# Patient Record
Sex: Male | Born: 1960 | Hispanic: No | Marital: Single | Smoking: Former smoker
Health system: Southern US, Community
[De-identification: ages and names within clinical notes are randomized; demographics above are authoritative.]

## PROBLEM LIST (undated history)

## (undated) DIAGNOSIS — F419 Anxiety disorder, unspecified: Secondary | ICD-10-CM

## (undated) DIAGNOSIS — F1010 Alcohol abuse, uncomplicated: Secondary | ICD-10-CM

## (undated) DIAGNOSIS — R569 Unspecified convulsions: Secondary | ICD-10-CM

## (undated) DIAGNOSIS — S069XAA Unspecified intracranial injury with loss of consciousness status unknown, initial encounter: Secondary | ICD-10-CM

## (undated) DIAGNOSIS — S069X9A Unspecified intracranial injury with loss of consciousness of unspecified duration, initial encounter: Secondary | ICD-10-CM

## (undated) DIAGNOSIS — I10 Essential (primary) hypertension: Secondary | ICD-10-CM

---

## 2014-12-24 ENCOUNTER — Encounter (HOSPITAL_COMMUNITY): Payer: Self-pay

## 2014-12-24 ENCOUNTER — Emergency Department (HOSPITAL_COMMUNITY): Payer: Medicare Other

## 2014-12-24 ENCOUNTER — Inpatient Hospital Stay (HOSPITAL_COMMUNITY)
Admission: EM | Admit: 2014-12-24 | Discharge: 2015-01-10 | DRG: 871 | Disposition: A | Discharge status: Skilled Nursing Facility | Payer: Medicare Other | Attending: Internal Medicine | Admitting: Internal Medicine

## 2014-12-24 DIAGNOSIS — Z66 Do not resuscitate: Secondary | ICD-10-CM | POA: Diagnosis not present

## 2014-12-24 DIAGNOSIS — R197 Diarrhea, unspecified: Secondary | ICD-10-CM | POA: Diagnosis not present

## 2014-12-24 DIAGNOSIS — R578 Other shock: Secondary | ICD-10-CM | POA: Diagnosis not present

## 2014-12-24 DIAGNOSIS — Z794 Long term (current) use of insulin: Secondary | ICD-10-CM

## 2014-12-24 DIAGNOSIS — R579 Shock, unspecified: Secondary | ICD-10-CM | POA: Diagnosis not present

## 2014-12-24 DIAGNOSIS — I959 Hypotension, unspecified: Secondary | ICD-10-CM | POA: Diagnosis not present

## 2014-12-24 DIAGNOSIS — G894 Chronic pain syndrome: Secondary | ICD-10-CM | POA: Diagnosis present

## 2014-12-24 DIAGNOSIS — Z95828 Presence of other vascular implants and grafts: Secondary | ICD-10-CM

## 2014-12-24 DIAGNOSIS — Z87820 Personal history of traumatic brain injury: Secondary | ICD-10-CM | POA: Diagnosis not present

## 2014-12-24 DIAGNOSIS — N201 Calculus of ureter: Secondary | ICD-10-CM

## 2014-12-24 DIAGNOSIS — Z79899 Other long term (current) drug therapy: Secondary | ICD-10-CM | POA: Diagnosis not present

## 2014-12-24 DIAGNOSIS — D62 Acute posthemorrhagic anemia: Secondary | ICD-10-CM | POA: Diagnosis present

## 2014-12-24 DIAGNOSIS — T8383XA Hemorrhage of genitourinary prosthetic devices, implants and grafts, initial encounter: Secondary | ICD-10-CM | POA: Diagnosis not present

## 2014-12-24 DIAGNOSIS — Y846 Urinary catheterization as the cause of abnormal reaction of the patient, or of later complication, without mention of misadventure at the time of the procedure: Secondary | ICD-10-CM | POA: Diagnosis not present

## 2014-12-24 DIAGNOSIS — R6521 Severe sepsis with septic shock: Secondary | ICD-10-CM | POA: Diagnosis not present

## 2014-12-24 DIAGNOSIS — Z930 Tracheostomy status: Secondary | ICD-10-CM

## 2014-12-24 DIAGNOSIS — R740 Nonspecific elevation of levels of transaminase and lactic acid dehydrogenase [LDH]: Secondary | ICD-10-CM | POA: Diagnosis not present

## 2014-12-24 DIAGNOSIS — Z4659 Encounter for fitting and adjustment of other gastrointestinal appliance and device: Secondary | ICD-10-CM

## 2014-12-24 DIAGNOSIS — J189 Pneumonia, unspecified organism: Secondary | ICD-10-CM | POA: Diagnosis present

## 2014-12-24 DIAGNOSIS — Z466 Encounter for fitting and adjustment of urinary device: Secondary | ICD-10-CM

## 2014-12-24 DIAGNOSIS — T473X5A Adverse effect of saline and osmotic laxatives, initial encounter: Secondary | ICD-10-CM | POA: Diagnosis not present

## 2014-12-24 DIAGNOSIS — Z4689 Encounter for fitting and adjustment of other specified devices: Secondary | ICD-10-CM

## 2014-12-24 DIAGNOSIS — Y95 Nosocomial condition: Secondary | ICD-10-CM | POA: Diagnosis not present

## 2014-12-24 DIAGNOSIS — D61818 Other pancytopenia: Secondary | ICD-10-CM | POA: Diagnosis present

## 2014-12-24 DIAGNOSIS — R1310 Dysphagia, unspecified: Secondary | ICD-10-CM | POA: Diagnosis not present

## 2014-12-24 DIAGNOSIS — Z931 Gastrostomy status: Secondary | ICD-10-CM | POA: Diagnosis not present

## 2014-12-24 DIAGNOSIS — I10 Essential (primary) hypertension: Secondary | ICD-10-CM | POA: Diagnosis not present

## 2014-12-24 DIAGNOSIS — G9340 Encephalopathy, unspecified: Secondary | ICD-10-CM | POA: Diagnosis not present

## 2014-12-24 DIAGNOSIS — R001 Bradycardia, unspecified: Secondary | ICD-10-CM | POA: Diagnosis not present

## 2014-12-24 DIAGNOSIS — R451 Restlessness and agitation: Secondary | ICD-10-CM | POA: Diagnosis present

## 2014-12-24 DIAGNOSIS — N179 Acute kidney failure, unspecified: Secondary | ICD-10-CM | POA: Diagnosis not present

## 2014-12-24 DIAGNOSIS — Z781 Physical restraint status: Secondary | ICD-10-CM | POA: Diagnosis not present

## 2014-12-24 DIAGNOSIS — K7460 Unspecified cirrhosis of liver: Secondary | ICD-10-CM | POA: Diagnosis not present

## 2014-12-24 DIAGNOSIS — I639 Cerebral infarction, unspecified: Secondary | ICD-10-CM

## 2014-12-24 DIAGNOSIS — I609 Nontraumatic subarachnoid hemorrhage, unspecified: Secondary | ICD-10-CM

## 2014-12-24 DIAGNOSIS — T50995A Adverse effect of other drugs, medicaments and biological substances, initial encounter: Secondary | ICD-10-CM | POA: Diagnosis not present

## 2014-12-24 DIAGNOSIS — E870 Hyperosmolality and hypernatremia: Secondary | ICD-10-CM | POA: Diagnosis not present

## 2014-12-24 DIAGNOSIS — S069XAA Unspecified intracranial injury with loss of consciousness status unknown, initial encounter: Secondary | ICD-10-CM | POA: Diagnosis present

## 2014-12-24 DIAGNOSIS — R319 Hematuria, unspecified: Secondary | ICD-10-CM | POA: Diagnosis present

## 2014-12-24 DIAGNOSIS — S3720XA Unspecified injury of bladder, initial encounter: Secondary | ICD-10-CM | POA: Diagnosis present

## 2014-12-24 DIAGNOSIS — E860 Dehydration: Secondary | ICD-10-CM | POA: Diagnosis not present

## 2014-12-24 DIAGNOSIS — D649 Anemia, unspecified: Secondary | ICD-10-CM

## 2014-12-24 DIAGNOSIS — Z515 Encounter for palliative care: Secondary | ICD-10-CM | POA: Insufficient documentation

## 2014-12-24 DIAGNOSIS — B952 Enterococcus as the cause of diseases classified elsewhere: Secondary | ICD-10-CM | POA: Diagnosis present

## 2014-12-24 DIAGNOSIS — S069X9A Unspecified intracranial injury with loss of consciousness of unspecified duration, initial encounter: Secondary | ICD-10-CM | POA: Diagnosis present

## 2014-12-24 DIAGNOSIS — J690 Pneumonitis due to inhalation of food and vomit: Secondary | ICD-10-CM | POA: Diagnosis not present

## 2014-12-24 DIAGNOSIS — R930 Abnormal findings on diagnostic imaging of skull and head, not elsewhere classified: Secondary | ICD-10-CM | POA: Diagnosis present

## 2014-12-24 DIAGNOSIS — J9690 Respiratory failure, unspecified, unspecified whether with hypoxia or hypercapnia: Secondary | ICD-10-CM

## 2014-12-24 DIAGNOSIS — G40909 Epilepsy, unspecified, not intractable, without status epilepticus: Secondary | ICD-10-CM

## 2014-12-24 DIAGNOSIS — Z0189 Encounter for other specified special examinations: Secondary | ICD-10-CM

## 2014-12-24 DIAGNOSIS — R7881 Bacteremia: Secondary | ICD-10-CM | POA: Diagnosis present

## 2014-12-24 DIAGNOSIS — A419 Sepsis, unspecified organism: Secondary | ICD-10-CM | POA: Diagnosis present

## 2014-12-24 DIAGNOSIS — F1011 Alcohol abuse, in remission: Secondary | ICD-10-CM | POA: Diagnosis present

## 2014-12-24 DIAGNOSIS — Z7401 Bed confinement status: Secondary | ICD-10-CM

## 2014-12-24 DIAGNOSIS — Z452 Encounter for adjustment and management of vascular access device: Secondary | ICD-10-CM

## 2014-12-24 HISTORY — DX: Essential (primary) hypertension: I10

## 2014-12-24 HISTORY — DX: Alcohol abuse, uncomplicated: F10.10

## 2014-12-24 HISTORY — DX: Unspecified convulsions: R56.9

## 2014-12-24 HISTORY — DX: Unspecified intracranial injury with loss of consciousness status unknown, initial encounter: S06.9XAA

## 2014-12-24 HISTORY — DX: Unspecified intracranial injury with loss of consciousness of unspecified duration, initial encounter: S06.9X9A

## 2014-12-24 HISTORY — DX: Anxiety disorder, unspecified: F41.9

## 2014-12-24 LAB — CBC WITH DIFFERENTIAL/PLATELET
Basophils Absolute: 0 10*3/uL (ref 0.0–0.1)
Basophils Relative: 0 % (ref 0–1)
Eosinophils Absolute: 0.2 10*3/uL (ref 0.0–0.7)
Eosinophils Relative: 2 % (ref 0–5)
HEMATOCRIT: 30.7 % — AB (ref 39.0–52.0)
HEMOGLOBIN: 10.1 g/dL — AB (ref 13.0–17.0)
Lymphocytes Relative: 20 % (ref 12–46)
Lymphs Abs: 1.7 10*3/uL (ref 0.7–4.0)
MCH: 30.6 pg (ref 26.0–34.0)
MCHC: 32.9 g/dL (ref 30.0–36.0)
MCV: 93 fL (ref 78.0–100.0)
MONO ABS: 0.6 10*3/uL (ref 0.1–1.0)
MONOS PCT: 7 % (ref 3–12)
NEUTROS ABS: 6.2 10*3/uL (ref 1.7–7.7)
NEUTROS PCT: 71 % (ref 43–77)
Platelets: 133 10*3/uL — ABNORMAL LOW (ref 150–400)
RBC: 3.3 MIL/uL — AB (ref 4.22–5.81)
RDW: 14.7 % (ref 11.5–15.5)
WBC: 8.7 10*3/uL (ref 4.0–10.5)

## 2014-12-24 LAB — I-STAT CG4 LACTIC ACID, ED: Lactic Acid, Venous: 1.55 mmol/L (ref 0.5–2.0)

## 2014-12-24 LAB — BASIC METABOLIC PANEL
Anion gap: 5 (ref 5–15)
BUN: 59 mg/dL — AB (ref 6–23)
CO2: 35 mmol/L — AB (ref 19–32)
Calcium: 8.7 mg/dL (ref 8.4–10.5)
Chloride: 93 mmol/L — ABNORMAL LOW (ref 96–112)
Creatinine, Ser: 4.07 mg/dL — ABNORMAL HIGH (ref 0.50–1.35)
GFR calc Af Amer: 18 mL/min — ABNORMAL LOW (ref >90)
GFR calc non Af Amer: 16 mL/min — ABNORMAL LOW (ref >90)
GLUCOSE: 105 mg/dL — AB (ref 70–99)
POTASSIUM: 4.7 mmol/L (ref 3.5–5.1)
Sodium: 133 mmol/L — ABNORMAL LOW (ref 135–145)

## 2014-12-24 LAB — TROPONIN I: TROPONIN I: 0.03 ng/mL (ref <0.031)

## 2014-12-24 LAB — PROTIME-INR
INR: 1.16 (ref 0.00–1.49)
Prothrombin Time: 14.9 seconds (ref 11.6–15.2)

## 2014-12-24 LAB — PREPARE RBC (CROSSMATCH)

## 2014-12-24 LAB — ABO/RH: ABO/RH(D): A POS

## 2014-12-24 MED ORDER — SODIUM CHLORIDE 0.9 % IV BOLUS (SEPSIS)
1000.0000 mL | Freq: Once | INTRAVENOUS | Status: AC
  Administered 2014-12-24: 1000 mL via INTRAVENOUS

## 2014-12-24 MED ORDER — DEXTROSE 5 % IV SOLN
0.0000 ug/min | Freq: Once | INTRAVENOUS | Status: DC
  Filled 2014-12-24 (×2): qty 4

## 2014-12-24 MED ORDER — LORAZEPAM 2 MG/ML IJ SOLN
1.0000 mg | Freq: Once | INTRAMUSCULAR | Status: AC
  Administered 2014-12-24: 1 mg via INTRAVENOUS

## 2014-12-24 MED ORDER — LORAZEPAM 2 MG/ML IJ SOLN
INTRAMUSCULAR | Status: AC
Start: 2014-12-24 — End: 2014-12-25
  Filled 2014-12-24: qty 1
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
## 2014-12-24 NOTE — ED Notes (Signed)
MD at bedside to start central line.
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
## 2014-12-24 NOTE — ED Notes (Signed)
Pt. Jerking as if having seizure-like activity, however patient able to look around. Dr. Manus Gunningancour at bedside. To be given

## 2014-12-24 NOTE — ED Notes (Signed)
Per EMS: [place] experienced [place], was trached with a #6 vortex trach, to tbar @28 %. Pt began having very little urinary output this evening. Night shift RN deflated foley, began seeing bright red frank blood. Foley removed, replaced with a 28Fr for irrigation. Blood continues to empty from urethra. EMS describes a firm taunt abdomen. Pt has PICC RUA, and PEG. Pt alert to self only. MD at bedside.

## 2014-12-24 NOTE — ED Notes (Signed)
Urology at bedside to place catheter. Upon removal of old catheter, patient bleed profusely from penis. After insertion, initial return of 1500 cc of blood.

## 2014-12-25 ENCOUNTER — Inpatient Hospital Stay (HOSPITAL_COMMUNITY): Payer: Medicare Other

## 2014-12-25 DIAGNOSIS — T50995A Adverse effect of other drugs, medicaments and biological substances, initial encounter: Secondary | ICD-10-CM | POA: Diagnosis not present

## 2014-12-25 DIAGNOSIS — Z87820 Personal history of traumatic brain injury: Secondary | ICD-10-CM | POA: Diagnosis not present

## 2014-12-25 DIAGNOSIS — R579 Shock, unspecified: Secondary | ICD-10-CM

## 2014-12-25 DIAGNOSIS — D62 Acute posthemorrhagic anemia: Secondary | ICD-10-CM | POA: Diagnosis not present

## 2014-12-25 DIAGNOSIS — Z930 Tracheostomy status: Secondary | ICD-10-CM

## 2014-12-25 DIAGNOSIS — R450 Nervousness: Secondary | ICD-10-CM | POA: Diagnosis not present

## 2014-12-25 DIAGNOSIS — R578 Other shock: Secondary | ICD-10-CM | POA: Diagnosis present

## 2014-12-25 DIAGNOSIS — I609 Nontraumatic subarachnoid hemorrhage, unspecified: Secondary | ICD-10-CM

## 2014-12-25 DIAGNOSIS — E860 Dehydration: Secondary | ICD-10-CM | POA: Diagnosis not present

## 2014-12-25 DIAGNOSIS — R6521 Severe sepsis with septic shock: Secondary | ICD-10-CM | POA: Diagnosis present

## 2014-12-25 DIAGNOSIS — I10 Essential (primary) hypertension: Secondary | ICD-10-CM | POA: Diagnosis not present

## 2014-12-25 DIAGNOSIS — Z7401 Bed confinement status: Secondary | ICD-10-CM

## 2014-12-25 DIAGNOSIS — R740 Nonspecific elevation of levels of transaminase and lactic acid dehydrogenase [LDH]: Secondary | ICD-10-CM | POA: Diagnosis not present

## 2014-12-25 DIAGNOSIS — R7881 Bacteremia: Secondary | ICD-10-CM

## 2014-12-25 DIAGNOSIS — Z515 Encounter for palliative care: Secondary | ICD-10-CM | POA: Diagnosis not present

## 2014-12-25 DIAGNOSIS — R930 Abnormal findings on diagnostic imaging of skull and head, not elsewhere classified: Secondary | ICD-10-CM

## 2014-12-25 DIAGNOSIS — Z931 Gastrostomy status: Secondary | ICD-10-CM | POA: Diagnosis not present

## 2014-12-25 DIAGNOSIS — G9340 Encephalopathy, unspecified: Secondary | ICD-10-CM | POA: Diagnosis not present

## 2014-12-25 DIAGNOSIS — K7460 Unspecified cirrhosis of liver: Secondary | ICD-10-CM | POA: Diagnosis not present

## 2014-12-25 DIAGNOSIS — Z781 Physical restraint status: Secondary | ICD-10-CM | POA: Diagnosis not present

## 2014-12-25 DIAGNOSIS — N179 Acute kidney failure, unspecified: Secondary | ICD-10-CM | POA: Diagnosis not present

## 2014-12-25 DIAGNOSIS — S069X9A Unspecified intracranial injury with loss of consciousness of unspecified duration, initial encounter: Secondary | ICD-10-CM | POA: Diagnosis present

## 2014-12-25 DIAGNOSIS — S069XAA Unspecified intracranial injury with loss of consciousness status unknown, initial encounter: Secondary | ICD-10-CM | POA: Diagnosis present

## 2014-12-25 DIAGNOSIS — G40909 Epilepsy, unspecified, not intractable, without status epilepticus: Secondary | ICD-10-CM

## 2014-12-25 DIAGNOSIS — F1010 Alcohol abuse, uncomplicated: Secondary | ICD-10-CM

## 2014-12-25 DIAGNOSIS — R451 Restlessness and agitation: Secondary | ICD-10-CM | POA: Diagnosis not present

## 2014-12-25 DIAGNOSIS — G894 Chronic pain syndrome: Secondary | ICD-10-CM | POA: Diagnosis present

## 2014-12-25 DIAGNOSIS — Z66 Do not resuscitate: Secondary | ICD-10-CM | POA: Diagnosis not present

## 2014-12-25 DIAGNOSIS — R1310 Dysphagia, unspecified: Secondary | ICD-10-CM | POA: Diagnosis not present

## 2014-12-25 DIAGNOSIS — J690 Pneumonitis due to inhalation of food and vomit: Secondary | ICD-10-CM | POA: Diagnosis not present

## 2014-12-25 DIAGNOSIS — Z794 Long term (current) use of insulin: Secondary | ICD-10-CM | POA: Diagnosis not present

## 2014-12-25 DIAGNOSIS — R001 Bradycardia, unspecified: Secondary | ICD-10-CM | POA: Diagnosis not present

## 2014-12-25 DIAGNOSIS — B952 Enterococcus as the cause of diseases classified elsewhere: Secondary | ICD-10-CM | POA: Diagnosis present

## 2014-12-25 DIAGNOSIS — Z466 Encounter for fitting and adjustment of urinary device: Secondary | ICD-10-CM | POA: Diagnosis not present

## 2014-12-25 DIAGNOSIS — I959 Hypotension, unspecified: Secondary | ICD-10-CM | POA: Diagnosis not present

## 2014-12-25 DIAGNOSIS — J189 Pneumonia, unspecified organism: Secondary | ICD-10-CM | POA: Diagnosis not present

## 2014-12-25 DIAGNOSIS — T473X5A Adverse effect of saline and osmotic laxatives, initial encounter: Secondary | ICD-10-CM | POA: Diagnosis not present

## 2014-12-25 DIAGNOSIS — T8383XA Hemorrhage of genitourinary prosthetic devices, implants and grafts, initial encounter: Secondary | ICD-10-CM | POA: Diagnosis not present

## 2014-12-25 DIAGNOSIS — D61818 Other pancytopenia: Secondary | ICD-10-CM | POA: Diagnosis not present

## 2014-12-25 DIAGNOSIS — Y95 Nosocomial condition: Secondary | ICD-10-CM | POA: Diagnosis not present

## 2014-12-25 DIAGNOSIS — Z95828 Presence of other vascular implants and grafts: Secondary | ICD-10-CM

## 2014-12-25 DIAGNOSIS — R319 Hematuria, unspecified: Secondary | ICD-10-CM

## 2014-12-25 DIAGNOSIS — S3720XA Unspecified injury of bladder, initial encounter: Secondary | ICD-10-CM | POA: Diagnosis present

## 2014-12-25 DIAGNOSIS — Z9889 Other specified postprocedural states: Secondary | ICD-10-CM

## 2014-12-25 DIAGNOSIS — Z79899 Other long term (current) drug therapy: Secondary | ICD-10-CM | POA: Diagnosis not present

## 2014-12-25 DIAGNOSIS — Y846 Urinary catheterization as the cause of abnormal reaction of the patient, or of later complication, without mention of misadventure at the time of the procedure: Secondary | ICD-10-CM | POA: Diagnosis not present

## 2014-12-25 DIAGNOSIS — E870 Hyperosmolality and hypernatremia: Secondary | ICD-10-CM | POA: Diagnosis not present

## 2014-12-25 DIAGNOSIS — F1011 Alcohol abuse, in remission: Secondary | ICD-10-CM | POA: Diagnosis present

## 2014-12-25 DIAGNOSIS — R197 Diarrhea, unspecified: Secondary | ICD-10-CM | POA: Diagnosis not present

## 2014-12-25 LAB — PROTIME-INR
INR: 1.22 (ref 0.00–1.49)
PROTHROMBIN TIME: 15.5 s — AB (ref 11.6–15.2)

## 2014-12-25 LAB — CBC WITH DIFFERENTIAL/PLATELET
BASOS ABS: 0 10*3/uL (ref 0.0–0.1)
Basophils Relative: 0 % (ref 0–1)
EOS ABS: 0.2 10*3/uL (ref 0.0–0.7)
Eosinophils Relative: 3 % (ref 0–5)
HCT: 25.7 % — ABNORMAL LOW (ref 39.0–52.0)
Hemoglobin: 8.5 g/dL — ABNORMAL LOW (ref 13.0–17.0)
LYMPHS ABS: 1 10*3/uL (ref 0.7–4.0)
Lymphocytes Relative: 20 % (ref 12–46)
MCH: 30.1 pg (ref 26.0–34.0)
MCHC: 33.1 g/dL (ref 30.0–36.0)
MCV: 91.1 fL (ref 78.0–100.0)
MONOS PCT: 8 % (ref 3–12)
Monocytes Absolute: 0.4 10*3/uL (ref 0.1–1.0)
Neutro Abs: 3.6 10*3/uL (ref 1.7–7.7)
Neutrophils Relative %: 70 % (ref 43–77)
PLATELETS: 86 10*3/uL — AB (ref 150–400)
RBC: 2.82 MIL/uL — AB (ref 4.22–5.81)
RDW: 15 % (ref 11.5–15.5)
WBC: 5.1 10*3/uL (ref 4.0–10.5)

## 2014-12-25 LAB — COMPREHENSIVE METABOLIC PANEL
ALK PHOS: 77 U/L (ref 39–117)
ALT: 33 U/L (ref 0–53)
AST: 31 U/L (ref 0–37)
Albumin: 2.4 g/dL — ABNORMAL LOW (ref 3.5–5.2)
Anion gap: 7 (ref 5–15)
BILIRUBIN TOTAL: 1 mg/dL (ref 0.3–1.2)
BUN: <5 mg/dL — ABNORMAL LOW (ref 6–23)
CALCIUM: 7.3 mg/dL — AB (ref 8.4–10.5)
CO2: 28 mmol/L (ref 19–32)
CREATININE: 1.8 mg/dL — AB (ref 0.50–1.35)
Chloride: 103 mmol/L (ref 96–112)
GFR calc Af Amer: 49 mL/min — ABNORMAL LOW (ref >90)
GFR calc non Af Amer: 42 mL/min — ABNORMAL LOW (ref >90)
GLUCOSE: 92 mg/dL (ref 70–99)
Potassium: 4 mmol/L (ref 3.5–5.1)
Sodium: 138 mmol/L (ref 135–145)
TOTAL PROTEIN: 5.7 g/dL — AB (ref 6.0–8.3)

## 2014-12-25 LAB — APTT
APTT: 34 s (ref 24–37)
APTT: 33 s (ref 24–37)

## 2014-12-25 LAB — VALPROIC ACID LEVEL

## 2014-12-25 LAB — LACTIC ACID, PLASMA: Lactic Acid, Venous: 0.7 mmol/L (ref 0.5–2.0)

## 2014-12-25 LAB — GLUCOSE, CAPILLARY: Glucose-Capillary: 92 mg/dL (ref 70–99)

## 2014-12-25 LAB — PHENOBARBITAL LEVEL: Phenobarbital: 27.9 ug/mL (ref 15.0–40.0)

## 2014-12-25 LAB — PROCALCITONIN: PROCALCITONIN: 0.23 ng/mL

## 2014-12-25 MED ORDER — HALOPERIDOL LACTATE 5 MG/ML IJ SOLN
1.0000 mg | INTRAMUSCULAR | Status: DC | PRN
  Administered 2014-12-25: 2 mg via INTRAVENOUS
  Administered 2014-12-25 – 2015-01-08 (×20): 4 mg via INTRAVENOUS
  Filled 2014-12-25 (×23): qty 1

## 2014-12-25 MED ORDER — ASPIRIN 300 MG RE SUPP: 300.0000 mg | RECTAL | Status: DC

## 2014-12-25 MED ORDER — ONDANSETRON HCL 4 MG/2ML IJ SOLN: 4.0000 mg | Freq: Four times a day (QID) | INTRAMUSCULAR | Status: DC | PRN

## 2014-12-25 MED ORDER — LORAZEPAM 2 MG/ML IJ SOLN
1.0000 mg | Freq: Once | INTRAMUSCULAR | Status: AC
  Administered 2014-12-25: 1 mg via INTRAVENOUS

## 2014-12-25 MED ORDER — ASPIRIN 81 MG PO CHEW: 324.0000 mg | CHEWABLE_TABLET | ORAL | Status: DC

## 2014-12-25 MED ORDER — IPRATROPIUM-ALBUTEROL 0.5-2.5 (3) MG/3ML IN SOLN: 3.0000 mL | Freq: Four times a day (QID) | RESPIRATORY_TRACT | Status: DC | PRN

## 2014-12-25 MED ORDER — DEXTROSE 5 % IV SOLN
2.0000 g | Freq: Once | INTRAVENOUS | Status: AC
  Administered 2014-12-25: 2 g via INTRAVENOUS
  Filled 2014-12-25: qty 2

## 2014-12-25 MED ORDER — MORPHINE SULFATE 2 MG/ML IJ SOLN
2.0000 mg | Freq: Once | INTRAMUSCULAR | Status: AC
Start: 2014-12-25 — End: 2014-12-25
  Administered 2014-12-25: 2 mg via INTRAVENOUS

## 2014-12-25 MED ORDER — SODIUM CHLORIDE 0.9 % IV SOLN
INTRAVENOUS | Status: DC
  Administered 2014-12-25: 03:00:00 via INTRAVENOUS

## 2014-12-25 MED ORDER — SODIUM CHLORIDE 0.9 % IV SOLN
250.0000 mL | INTRAVENOUS | Status: DC
  Administered 2014-12-26: 250 mL via INTRAVENOUS

## 2014-12-25 MED ORDER — FENTANYL CITRATE 0.05 MG/ML IJ SOLN: 50.0000 ug | Freq: Once | INTRAMUSCULAR | Status: DC

## 2014-12-25 MED ORDER — FENTANYL CITRATE 0.05 MG/ML IJ SOLN
INTRAMUSCULAR | Status: AC
Start: 2014-12-25 — End: 2014-12-25
  Administered 2014-12-25: 50 ug
  Filled 2014-12-25: qty 2

## 2014-12-25 MED ORDER — MORPHINE SULFATE 2 MG/ML IJ SOLN
2.0000 mg | INTRAMUSCULAR | Status: DC | PRN
  Administered 2014-12-25: 2 mg via INTRAVENOUS
  Filled 2014-12-25 (×2): qty 1

## 2014-12-25 MED ORDER — DEXTROSE 5 % IV SOLN
1.0000 g | Freq: Two times a day (BID) | INTRAVENOUS | Status: DC
  Administered 2014-12-25 – 2014-12-26 (×2): 1 g via INTRAVENOUS
  Filled 2014-12-25 (×3): qty 1

## 2014-12-25 MED ORDER — MORPHINE SULFATE 2 MG/ML IJ SOLN
2.0000 mg | INTRAMUSCULAR | Status: DC | PRN
Start: 2014-12-25 — End: 2014-12-31
  Administered 2014-12-25 – 2014-12-28 (×3): 2 mg via INTRAVENOUS
  Filled 2014-12-25 (×4): qty 1

## 2014-12-25 MED ORDER — DEXMEDETOMIDINE HCL IN NACL 400 MCG/100ML IV SOLN
0.4000 ug/kg/h | INTRAVENOUS | Status: DC
  Administered 2014-12-25: 0.4 ug/kg/h via INTRAVENOUS
  Administered 2014-12-26 (×2): 1 ug/kg/h via INTRAVENOUS
  Administered 2014-12-27: 0.8 ug/kg/h via INTRAVENOUS
  Administered 2014-12-27: 1 ug/kg/h via INTRAVENOUS
  Administered 2014-12-27: 1.2 ug/kg/h via INTRAVENOUS
  Administered 2014-12-27: 0.599 ug/kg/h via INTRAVENOUS
  Administered 2014-12-27: 0.8 ug/kg/h via INTRAVENOUS
  Administered 2014-12-28: 0.3 ug/kg/h via INTRAVENOUS
  Administered 2014-12-29: 0.7 ug/kg/h via INTRAVENOUS
  Filled 2014-12-25 (×12): qty 100

## 2014-12-25 MED ORDER — VANCOMYCIN HCL IN DEXTROSE 750-5 MG/150ML-% IV SOLN
750.0000 mg | Freq: Two times a day (BID) | INTRAVENOUS | Status: DC
  Administered 2014-12-25 – 2014-12-26 (×2): 750 mg via INTRAVENOUS
  Filled 2014-12-25 (×3): qty 150

## 2014-12-25 MED ORDER — VITAMIN B-1 100 MG PO TABS
100.0000 mg | ORAL_TABLET | Freq: Every day | ORAL | Status: DC
  Administered 2014-12-25 – 2014-12-31 (×7): 100 mg
  Filled 2014-12-25 (×7): qty 1

## 2014-12-25 MED ORDER — MORPHINE SULFATE 15 MG PO TABS
30.0000 mg | ORAL_TABLET | Freq: Four times a day (QID) | ORAL | Status: DC
  Administered 2014-12-25 – 2014-12-31 (×22): 30 mg via ORAL
  Filled 2014-12-25 (×23): qty 2

## 2014-12-25 MED ORDER — LORAZEPAM 2 MG/ML IJ SOLN
INTRAMUSCULAR | Status: AC
  Filled 2014-12-25: qty 1

## 2014-12-25 MED ORDER — ARIPIPRAZOLE 10 MG PO TABS
10.0000 mg | ORAL_TABLET | Freq: Two times a day (BID) | ORAL | Status: DC
  Administered 2014-12-25 – 2014-12-29 (×9): 10 mg
  Filled 2014-12-25 (×11): qty 1

## 2014-12-25 MED ORDER — LORAZEPAM 2 MG/ML IJ SOLN
2.0000 mg | INTRAMUSCULAR | Status: DC | PRN
  Administered 2014-12-25 – 2014-12-31 (×17): 2 mg via INTRAVENOUS
  Filled 2014-12-25 (×16): qty 1

## 2014-12-25 MED ORDER — BACLOFEN 10 MG PO TABS
10.0000 mg | ORAL_TABLET | Freq: Two times a day (BID) | ORAL | Status: DC
  Administered 2014-12-25 – 2014-12-31 (×13): 10 mg via ORAL
  Filled 2014-12-25 (×15): qty 1

## 2014-12-25 MED ORDER — VALPROATE SODIUM 500 MG/5ML IV SOLN
125.0000 mg | Freq: Two times a day (BID) | INTRAVENOUS | Status: DC
  Administered 2014-12-25 – 2014-12-27 (×5): 125 mg via INTRAVENOUS
  Filled 2014-12-25 (×6): qty 1.25

## 2014-12-25 MED ORDER — CHLORHEXIDINE GLUCONATE 0.12 % MT SOLN
15.0000 mL | Freq: Four times a day (QID) | OROMUCOSAL | Status: DC
  Administered 2014-12-25 – 2014-12-27 (×12): 15 mL via OROMUCOSAL
  Filled 2014-12-25 (×11): qty 15

## 2014-12-25 MED ORDER — VANCOMYCIN HCL IN DEXTROSE 1-5 GM/200ML-% IV SOLN: 1000.0000 mg | INTRAVENOUS | Status: DC

## 2014-12-25 MED ORDER — PANTOPRAZOLE SODIUM 40 MG IV SOLR
40.0000 mg | Freq: Every day | INTRAVENOUS | Status: DC
  Administered 2014-12-25 – 2014-12-26 (×2): 40 mg via INTRAVENOUS
  Filled 2014-12-25 (×4): qty 40

## 2014-12-25 MED ORDER — SODIUM CHLORIDE 0.9 % IV BOLUS (SEPSIS)
1000.0000 mL | Freq: Once | INTRAVENOUS | Status: AC
  Administered 2014-12-25: 1000 mL via INTRAVENOUS

## 2014-12-25 MED ORDER — CEFEPIME HCL 1 G IJ SOLR: 1.0000 g | INTRAMUSCULAR | Status: DC

## 2014-12-25 MED ORDER — SERTRALINE HCL 100 MG PO TABS
100.0000 mg | ORAL_TABLET | Freq: Every day | ORAL | Status: DC
  Administered 2014-12-25 – 2014-12-31 (×7): 100 mg
  Filled 2014-12-25 (×7): qty 1

## 2014-12-25 MED ORDER — PHENOBARBITAL SODIUM 65 MG/ML IJ SOLN
32.5000 mg | Freq: Three times a day (TID) | INTRAMUSCULAR | Status: DC
  Administered 2014-12-25 – 2014-12-31 (×19): 32.5 mg via INTRAVENOUS
  Filled 2014-12-25 (×19): qty 1

## 2014-12-25 MED ORDER — CLONAZEPAM 0.5 MG PO TABS: 0.5000 mg | ORAL_TABLET | Freq: Four times a day (QID) | ORAL | Status: DC | PRN

## 2014-12-25 MED ORDER — LORAZEPAM 2 MG/ML IJ SOLN
1.0000 mg | INTRAMUSCULAR | Status: DC | PRN
Start: 2014-12-25 — End: 2014-12-25
  Administered 2014-12-25 (×3): 1 mg via INTRAVENOUS
  Filled 2014-12-25 (×3): qty 1

## 2014-12-25 MED ORDER — VANCOMYCIN HCL IN DEXTROSE 1-5 GM/200ML-% IV SOLN
1000.0000 mg | Freq: Once | INTRAVENOUS | Status: AC
  Administered 2014-12-25: 1000 mg via INTRAVENOUS
  Filled 2014-12-25: qty 200

## 2014-12-25 NOTE — ED Notes (Signed)
Report attempted 

## 2014-12-25 NOTE — Progress Notes (Addendum)
ANTIBIOTIC CONSULT NOTE - INITIAL  Pharmacy Consult for vancomycin and cefepime Indication: rule out pneumonia  Allergies  Allergen Reactions  . Meropenem     Patient Measurements: Height: 5\' 8"  (172.7 cm) Weight: 201 lb 11.5 oz (91.5 kg) IBW/kg (Calculated) : 68.4 Adjusted Body Weight:   Vital Signs: Temp: 98 F (36.7 C) (04/02 0200) Temp Source: Oral (04/02 0200) BP: 119/65 mmHg (04/02 0215) Pulse Rate: 75 (04/02 0215) Intake/Output from previous day: 04/01 0701 - 04/02 0700 In: [id number]  Out: 14500 [Urine:14500] Intake/Output from this shift: Total I/O In: [id number] [Other:12800] Out: 14500 [Urine:14500]  Labs:  Recent Labs  12/24/14 2157  WBC 8.7  HGB 10.1*  PLT 133*  CREATININE 4.07*   Estimated Creatinine Clearance: 23.6 mL/min (by C-G formula based on Cr of 4.07). No results for input(s): VANCOTROUGH, VANCOPEAK, VANCORANDOM, GENTTROUGH, GENTPEAK, GENTRANDOM, TOBRATROUGH, TOBRAPEAK, TOBRARND, AMIKACINPEAK, AMIKACINTROU, AMIKACIN in the last 72 hours.   Microbiology: No results found for this or any previous visit (from the past 720 hour(s)).  Medical History: Past Medical History  Diagnosis Date  . Hypertension   . Seizures   . Anxiety   . Traumatic brain injury   . Alcohol abuse     Medications:  Prescriptions prior to admission  Medication Sig Dispense Refill Last Dose  . acetaminophen (TYLENOL) 650 MG CR tablet Give 650 mg by tube every 4 (four) hours as needed for pain.   prn  . antiseptic oral rinse (BIOTENE) LIQD 1 application by Mouth Rinse route every 2 (two) hours as needed for dry mouth.   PRN  . apixaban (ELIQUIS) 5 MG TABS tablet Take 5 mg by mouth once.   12/24/2014 at 0700  . ARIPiprazole (ABILIFY) 10 MG tablet Give 10 mg by tube every 12 (twelve) hours.    12/24/2014 at Unknown time  . ARTIFICIAL TEAR OINTMENT OP Apply 1 application to eye every 12 (twelve) hours as needed (FOR DRY EYES).   PRN  . baclofen (LIORESAL) 10 MG tablet Give 10 mg  by tube every 12 (twelve) hours.    12/24/2014 at Unknown time  . chlorhexidine (PERIDEX) 0.12 % solution Use as directed 15 mLs in the mouth or throat every 6 (six) hours. For dental care   12/24/2014 at Unknown time  . clonazePAM (KLONOPIN) 0.5 MG tablet Give 0.5 mg by tube every 6 (six) hours as needed for anxiety.    12/24/2014 at Unknown time  . diphenhydrAMINE (BENADRYL) 50 MG/ML injection Inject 25 mg into the vein every 6 (six) hours as needed for itching or allergies.   12/24/2014 at Unknown time  . divalproex (DEPAKOTE SPRINKLE) 125 MG capsule Give 125 mg by tube every 12 (twelve) hours.    12/24/2014 at Unknown time  . HYDRALAZINE HCL IJ Inject 10 mg as directed every 6 (six) hours as needed (for heart rhytym).   PRN  . Hypromellose 0.2 % SOLN Apply 2 drops to eye every 6 (six) hours.   12/24/2014 at Unknown time  . insulin regular (NOVOLIN R,HUMULIN R) 100 units/mL injection Inject into the skin 3 (three) times daily before meals.   12/24/2014 at Unknown time  . ipratropium-albuterol (DUONEB) 0.5-2.5 (3) MG/3ML SOLN Take 3 mLs by nebulization every 6 (six) hours as needed (for shortness of breath).   PRN  . LISINOPRIL PO Give 1 tablet by tube daily.   unknown  . LORAZEPAM IJ Inject 1 mg as directed every 4 (four) hours as needed (for agitation).   PRN  .  morphine (MSIR) 30 MG tablet Give 30 mg by tube every 6 (six) hours.    12/24/2014 at Unknown time  . Morphine Sulfate, PF, 2 MG/ML SOLN Inject 2 mg into the vein every 4 (four) hours as needed (for pain).   PRN  . neomycin-bacitracin-polymyxin (NEOSPORIN) OINT Apply 1 application topically every 12 (twelve) hours.   12/24/2014 at Unknown time  . Nutritional Supplements (*STUDY* FEEDING SUPPLEMENT, IMPACT PEPTIDE 1.5,) LIQD Place 300 mLs into feeding tube every 6 (six) hours.   12/24/2014 at Unknown time  . nystatin cream (MYCOSTATIN) Apply 1 application topically 3 (three) times daily.   12/24/2014 at Unknown time  . ondansetron (ZOFRAN) 4 MG/2ML SOLN  injection Inject 4 mg into the vein every 4 (four) hours as needed for nausea or vomiting.   PRN  . pantoprazole (PROTONIX) 40 MG injection Inject 40 mg into the vein daily.   12/24/2014 at Unknown time  . PHENobarbital (LUMINAL) 32.4 MG tablet Give 32.4 mg by tube every 8 (eight) hours.    12/24/2014 at Unknown time  . polyethylene glycol (MIRALAX / GLYCOLAX) packet Give 17 g by tube daily.    12/24/2014 at Unknown time  . sertraline (ZOLOFT) 100 MG tablet Give 100 mg by tube daily.    12/24/2014 at Unknown time  . Skin Protectants, Misc. (EUCERIN) cream Apply 1 application topically every 12 (twelve) hours.   12/24/2014 at Unknown time  . thiamine (VITAMIN B-1) 100 MG tablet Give 100 mg by tube daily.    12/24/2014 at Unknown time  . Water For Irrigation, Sterile (FREE WATER) SOLN Place 100 mLs into feeding tube every 4 (four) hours.   12/24/2014 at Unknown time   Scheduled:  . sodium chloride   Intravenous STAT  . ceFEPime (MAXIPIME) IV  2 g Intravenous Once  . norepinephrine (LEVOPHED) Adult infusion  0-40 mcg/min Intravenous Once  . sodium chloride  1,000 mL Intravenous Once  . vancomycin  1,000 mg Intravenous Once   Infusions:   Assessment: 53yo male with history of HTN, seizures, TBI and EtOH abuse presents with hematuria. Pharmacy is consulted to dose cefepime and vancomycin for suspected HCAP. Pt is afebrile, WBC wnl, sCr 4.1, LA 1.6.  Goal of Therapy:  Vancomycin trough level 15-20 mcg/ml  Plan:  Cefepime 2g IV once followed by 1g q24h Vancomycin 1g IV q24h Expected duration 7 days with resolution of temperature and/or normalization of WBC Measure antibiotic drug levels at steady state Follow up culture results, renal function, and clinical course  Arlean Hopping. Newman Pies, PharmD Clinical Pharmacist Pager [phone] 12/25/2014,2:48 AM   ADDENDUM:  Pt's SCr improved significantly now down to 1.8. CrCl ~2ml/min.  Plan: Change cefepime to 1g IV Q12 Change vancomycin to  IV Q12

## 2014-12-25 NOTE — Progress Notes (Signed)
Triple lumen left subclavian central line placement verified by CXR. Okay to use per Dr. Arsenio LoaderSommer.

## 2014-12-25 NOTE — Progress Notes (Signed)
Called by RN for pt dropped sat to 80's. RN had attempted to suction and received small secretions. RT attempted and received copious amounts of thick yellow sputum. SPO2 97% Pt tolerated well. RT will continue to monitor. BBS clear; diminished.
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
## 2014-12-25 NOTE — Progress Notes (Signed)
Patient seen and examined, currently vital stable, no purposeful interaction, not seem in distress at this moment. S/p trach on 5liter oxygen, Lung rhonchi bilateral, heart RRR, ab, peg tube removed (by patient earlier during MRI) testing. On TBI, foley bag with visible hematuria.  Critical care/urology following. Critical care ordered ng for now and IR to replace PEG tube.
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
## 2014-12-25 NOTE — Progress Notes (Signed)
INITIAL NUTRITION ASSESSMENT  DOCUMENTATION CODES Per approved criteria  -Obesity Unspecified   INTERVENTION: Once pt is able to start nutrition,  Recommend Vital AF 1.2 via PEG at 20 ml/hr and increase by 10 ml every 4 hours to goal rate of 70 to provide 2016 kcal, 126 grams of protein, and 1361 ml of free water.  NUTRITION DIAGNOSIS: Inadequate oral intake related to inability to eat as evidenced by NPO status  Goal: Pt to meet >/= 90% of their estimated nutrition needs   Monitor:  TF initiation/tolerance, weight trends, labs, I/O's  Reason for Assessment: MD consult for assessment and recommendations for tube feeding  54 y.o. male  Admitting Dx: Hemorrhagic shock  ASSESSMENT: Pt with PMH of TBI, subarachnoid hemorrhage, subdural hemorrhage, cerebral contusion, hypertension, alcohol abuse, tracheostomy status, PEG tube status, multiple rib fractures, T1 fracture, enterococcal bacteremia, IVC filter placement. Pt from [facility], presents with hematuria.  Per MD note, pt with sluggish bowel sounds and will hold TF until bowel movements. Noted pt with a J-tube. During time of visit, pt was unresponsive to questions asked. No family at bedside. Unable to obtain nutrition hx. Spoke with RN, she confirms pt has been nonverbal since admission. Plans for MRI today.   Pt with no observed significant fat or muscle mass loss.   Labs: Low BUN, calcium, and GFR. High creatinine.  Height: Ht Readings from Last 1 Encounters:  12/25/14 5\' 8"  (1.727 m)    Weight: Wt Readings from Last 1 Encounters:  12/25/14 201 lb 11.5 oz (91.5 kg)    Ideal Body Weight: 154 lbs  % Ideal Body Weight: 131%  Wt Readings from Last 10 Encounters:  12/25/14 201 lb 11.5 oz (91.5 kg)    Usual Body Weight: Unknown  % Usual Body Weight: ---  BMI:  Body mass index is 30.68 kg/(m^2). Class I obesity  Estimated Nutritional Needs: Kcal: 2000-2200 Protein: 110-125 grams Fluid: 2 - 2.2 L/day  Skin:  +1 generalized edema  Diet Order: Diet NPO time specified Except for: Sips with Meds  EDUCATION NEEDS: -Education not appropriate at this time   Intake/Output Summary (Last 24 hours) at 12/25/14 0752 Last data filed at 12/25/14 0500  Gross per 24 hour  Intake  [id number] ml  Output  19650 ml  Net   -515 ml    Last BM: PTA  Labs:   Recent Labs Lab 12/24/14 2157  NA 133*  K 4.7  CL 93*  CO2 35*  BUN 59*  CREATININE 4.07*  CALCIUM 8.7  GLUCOSE 105*    CBG (last 3)   Recent Labs  12/24/14 2136  GLUCAP 92    Scheduled Meds: . ARIPiprazole  10 mg Per Tube Q12H  . baclofen  10 mg Oral Q12H  . [START ON 12/26/2014] ceFEPime (MAXIPIME) IV  1 g Intravenous Q24H  . chlorhexidine  15 mL Mouth/Throat Q6H  . morphine  30 mg Oral Q6H  . norepinephrine (LEVOPHED) Adult infusion  0-40 mcg/min Intravenous Once  . pantoprazole  40 mg Intravenous Daily  . PHENObarbital  32.5 mg Intravenous 3 times per day  . sertraline  100 mg Per Tube Daily  . thiamine  100 mg Per Tube Daily  . valproate sodium  125 mg Intravenous Q12H  . [START ON 12/26/2014] vancomycin  1,000 mg Intravenous Q24H    Continuous Infusions: . sodium chloride 250 mL (12/25/14 0356)    Past Medical History  Diagnosis Date  . Hypertension   . Seizures   .  Anxiety   . Traumatic brain injury   . Alcohol abuse     No past surgical history on file.  Marijean Niemann, MS, RD, LDN Pager # [phone] After hours/ weekend pager # [phone]

## 2014-12-25 NOTE — Procedures (Signed)
Central Venous Catheter Insertion Procedure Note Antonio EpsteinJack Tyler [id number] April 06, 1961  Procedure: Insertion of Central Venous Catheter Indications: Assessment of intravascular volume, Drug and/or fluid administration and Frequent blood sampling  Procedure Details Consent: Unable to obtain consent because of emergent medical necessity. Time Out: Verified patient identification, verified procedure, site/side was marked, verified correct patient position, special equipment/implants available, medications/allergies/relevent history reviewed, required imaging and test results available.  Performed  Maximum sterile technique was used including antiseptics, cap, gloves, gown, hand hygiene, mask and sheet. Skin prep: Chlorhexidine; local anesthetic administered A antimicrobial bonded/coated triple lumen catheter was placed in the left subclavian vein using the Seldinger technique.  Evaluation Blood flow good Complications: No apparent complications Patient did tolerate procedure well. Chest X-ray ordered to verify placement.  CXR: normal.  Antonio Tyler 12/25/2014, 5:48 AM

## 2014-12-26 DIAGNOSIS — R1310 Dysphagia, unspecified: Secondary | ICD-10-CM

## 2014-12-26 DIAGNOSIS — R451 Restlessness and agitation: Secondary | ICD-10-CM | POA: Diagnosis present

## 2014-12-26 DIAGNOSIS — R450 Nervousness: Secondary | ICD-10-CM

## 2014-12-26 LAB — CBC
HCT: 23.6 % — ABNORMAL LOW (ref 39.0–52.0)
HEMATOCRIT: 27.8 % — AB (ref 39.0–52.0)
HEMOGLOBIN: 9.2 g/dL — AB (ref 13.0–17.0)
Hemoglobin: 7.8 g/dL — ABNORMAL LOW (ref 13.0–17.0)
MCH: 30.1 pg (ref 26.0–34.0)
MCH: 30.6 pg (ref 26.0–34.0)
MCHC: 33.1 g/dL (ref 30.0–36.0)
MCHC: 33.1 g/dL (ref 30.0–36.0)
MCV: 90.8 fL (ref 78.0–100.0)
MCV: 92.5 fL (ref 78.0–100.0)
Platelets: 72 10*3/uL — ABNORMAL LOW (ref 150–400)
Platelets: 73 10*3/uL — ABNORMAL LOW (ref 150–400)
RBC: 2.55 MIL/uL — ABNORMAL LOW (ref 4.22–5.81)
RBC: 3.06 MIL/uL — AB (ref 4.22–5.81)
RDW: 15.2 % (ref 11.5–15.5)
RDW: 15.2 % (ref 11.5–15.5)
WBC: 3 10*3/uL — AB (ref 4.0–10.5)
WBC: 3.2 10*3/uL — AB (ref 4.0–10.5)

## 2014-12-26 LAB — TYPE AND SCREEN
ABO/RH(D): A POS
Antibody Screen: NEGATIVE
UNIT DIVISION: 0
Unit division: 0
Unit division: 0
Unit division: 0

## 2014-12-26 LAB — BASIC METABOLIC PANEL
Anion gap: 8 (ref 5–15)
BUN: 29 mg/dL — ABNORMAL HIGH (ref 6–23)
CO2: 28 mmol/L (ref 19–32)
CREATININE: 0.67 mg/dL (ref 0.50–1.35)
Calcium: 8.6 mg/dL (ref 8.4–10.5)
Chloride: 105 mmol/L (ref 96–112)
GFR calc non Af Amer: >90 mL/min (ref >90)
Glucose, Bld: 94 mg/dL (ref 70–99)
Potassium: 3.9 mmol/L (ref 3.5–5.1)
SODIUM: 141 mmol/L (ref 135–145)

## 2014-12-26 MED ORDER — VANCOMYCIN HCL IN DEXTROSE 1-5 GM/200ML-% IV SOLN
1000.0000 mg | Freq: Three times a day (TID) | INTRAVENOUS | Status: DC
  Administered 2014-12-26 – 2014-12-28 (×6): 1000 mg via INTRAVENOUS
  Filled 2014-12-26 (×7): qty 200

## 2014-12-26 MED ORDER — DEXTROSE 5 % IV SOLN
1.0000 g | Freq: Three times a day (TID) | INTRAVENOUS | Status: DC
  Administered 2014-12-26 – 2014-12-28 (×6): 1 g via INTRAVENOUS
  Filled 2014-12-26 (×7): qty 1
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
## 2014-12-26 NOTE — Progress Notes (Signed)
UR Completed.  [phone]  
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
## 2014-12-26 NOTE — Progress Notes (Signed)
ANTIBIOTIC CONSULT NOTE - FOLLOW UP  Pharmacy Consult for Vancomycin and cefepime Indication: r/o PNA  Allergies  Allergen Reactions  . Meropenem     Patient Measurements: Height: 5\' 8"  (172.7 cm) Weight: 199 lb 1.2 oz (90.3 kg) IBW/kg (Calculated) : 68.4  Vital Signs: Temp: 98.1 F (36.7 C) (04/03 0800) Temp Source: Axillary (04/03 0400) BP: 64/57 mmHg (04/03 0726) Pulse Rate: 65 (04/03 0726) Intake/Output from previous day: 04/02 0701 - 04/03 0700 In: 36120.4 [I.V.:2359.1; NG/GT:110; IV Piggyback:551.3] Out: [id number] [Urine:34650] Intake/Output from this shift:    Labs:  Recent Labs  12/24/14 2157 12/25/14 0711 12/25/14 2343 12/26/14 0300  WBC 8.7 5.1 3.2* 3.0*  HGB 10.1* 8.5* 7.8* 9.2*  PLT 133* 86* 72* 73*  CREATININE 4.07* 1.80*  --  0.67   Estimated Creatinine Clearance: 119.3 mL/min (by C-G formula based on Cr of 0.67). No results for input(s): VANCOTROUGH, VANCOPEAK, VANCORANDOM, GENTTROUGH, GENTPEAK, GENTRANDOM, TOBRATROUGH, TOBRAPEAK, TOBRARND, AMIKACINPEAK, AMIKACINTROU, AMIKACIN in the last 72 hours.   Microbiology: Recent Results (from the past 720 hour(s))  Blood culture (routine x 2)     Status: None (Preliminary result)   Collection Time: 12/24/14 11:35 PM  Result Value Ref Range Status   Specimen Description BLOOD LEFT HAND  Final   Special Requests BOTTLES DRAWN AEROBIC ONLY 8CC  Final   Culture   Final           BLOOD CULTURE RECEIVED NO GROWTH TO DATE CULTURE WILL BE HELD FOR 5 DAYS BEFORE ISSUING A FINAL NEGATIVE REPORT [facility]    Report Status PENDING  Incomplete  Blood culture (routine x 2)     Status: None (Preliminary result)   Collection Time: 12/25/14 12:34 AM  Result Value Ref Range Status   Specimen Description BLOOD LEFT HAND  Final   Special Requests BOTTLES DRAWN AEROBIC AND ANAEROBIC 5CC  Final   Culture   Final           BLOOD CULTURE RECEIVED NO GROWTH TO DATE CULTURE WILL BE HELD FOR 5 DAYS BEFORE  ISSUING A FINAL NEGATIVE REPORT Performed at [facility]    Report Status PENDING  Incomplete    Anti-infectives    Start     Dose/Rate Route Frequency Ordered Stop   12/26/14 0300  ceFEPIme (MAXIPIME) 1 g in dextrose 5 % 50 mL IVPB  Status:  Discontinued     1 g 100 mL/hr over 30 Minutes Intravenous Every 24 hours 12/25/14 0253 12/25/14 1232   12/26/14 0300  vancomycin (VANCOCIN) IVPB 1000 mg/200 mL premix  Status:  Discontinued     1,000 mg 200 mL/hr over 60 Minutes Intravenous Every 24 hours 12/25/14 0253 12/25/14 1232   12/25/14 1700  ceFEPIme (MAXIPIME) 1 g in dextrose 5 % 50 mL IVPB     1 g 100 mL/hr over 30 Minutes Intravenous Every 12 hours 12/25/14 1232     12/25/14 1600  vancomycin (VANCOCIN) IVPB 750 mg/150 ml premix     750 mg 150 mL/hr over 60 Minutes Intravenous Every 12 hours 12/25/14 1232     12/25/14 0300  ceFEPIme (MAXIPIME) 2 g in dextrose 5 % 50 mL IVPB     2 g 100 mL/hr over 30 Minutes Intravenous  Once 12/25/14 0243 12/25/14 0430   12/25/14 0300  vancomycin (VANCOCIN) IVPB 1000 mg/200 mL premix     1,000 mg 200 mL/hr over 60 Minutes Intravenous  Once 12/25/14 0243 12/25/14 0500  Assessment: 54yo male with history of HTN, seizures, TBI and EtOH abuse presents with hematuria. Pharmacy is consulted to dose cefepime and vancomycin for suspected HCAP. Now day #2 for r/o PNA. Pt is afebrile, WBC wnl. SCr drastically improved to 0.67 from 1.8.  Goal of Therapy:  Vancomycin trough level 15-20 mcg/ml  Resolution of infection  Plan:  Change Cefepime to 1g IV Q8 Change Vancomycin to 1g IV Q8 Monitor renal function, clinical picture, VT prn F/U C&S, need for abx?  Antonio Tyler 12/26/2014,9:46 AM
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
## 2014-12-26 NOTE — Progress Notes (Signed)
In report, RN was told that patient did not follow commands and was agitated throughout the day and pulled out PEG tube. When assessing patient at 1930, he was very agitated, gave 4 of haldol, and it didn't help. Gave 1 mg of ativan, at this point around 2000 patient was very agitated and pulled out part of the CBI and physically combative trying to pull out tubes, unable to calm down. Gave 2 of morphine, still unable to calm pt down. Called Elink and got an order of another 1mg  ativan. Three RNs required to be in the room to help calm the patient and prevent pulling out foley/CBI and other lines. Elink MD aware and visually patient, also the fellow Christianto came to bedside. Orders received. Pt on medication and restrained. Will continue to monitor.

## 2014-12-26 NOTE — Progress Notes (Signed)
IR PA aware of request for replacement of gastrostomy tube that has fell out 4/2, after reviewing images with Dr. Miles CostainShick the previous G-tube did extend through the inferior edge of the liver and we would recommend a brand new placement in a lower location without liver involvement. Will discuss further with Radiologist regarding time for healing of previous site this week and plan for placement when appropriate. Patient with NGT now.   Antonio BossKoreen Vail Vuncannon PA-C Interventional Radiology  12/26/14  10:49 AM

## 2014-12-27 ENCOUNTER — Inpatient Hospital Stay (HOSPITAL_COMMUNITY): Payer: Medicare Other

## 2014-12-27 DIAGNOSIS — S069X0A Unspecified intracranial injury without loss of consciousness, initial encounter: Secondary | ICD-10-CM

## 2014-12-27 LAB — BASIC METABOLIC PANEL
ANION GAP: 7 (ref 5–15)
BUN: 13 mg/dL (ref 6–23)
CALCIUM: 8.4 mg/dL (ref 8.4–10.5)
CO2: 30 mmol/L (ref 19–32)
Chloride: 109 mmol/L (ref 96–112)
Creatinine, Ser: 0.51 mg/dL (ref 0.50–1.35)
GFR calc non Af Amer: >90 mL/min (ref >90)
Glucose, Bld: 124 mg/dL — ABNORMAL HIGH (ref 70–99)
POTASSIUM: 3.6 mmol/L (ref 3.5–5.1)
SODIUM: 146 mmol/L — AB (ref 135–145)

## 2014-12-27 LAB — HEPATIC FUNCTION PANEL
ALBUMIN: 2.6 g/dL — AB (ref 3.5–5.2)
ALT: 35 U/L (ref 0–53)
AST: 37 U/L (ref 0–37)
Alkaline Phosphatase: 84 U/L (ref 39–117)
Bilirubin, Direct: 0.2 mg/dL (ref 0.0–0.5)
Indirect Bilirubin: 0.6 mg/dL (ref 0.3–0.9)
TOTAL PROTEIN: 5.8 g/dL — AB (ref 6.0–8.3)
Total Bilirubin: 0.8 mg/dL (ref 0.3–1.2)

## 2014-12-27 LAB — PHOSPHORUS: Phosphorus: 3.3 mg/dL (ref 2.3–4.6)

## 2014-12-27 LAB — MAGNESIUM: Magnesium: 1.7 mg/dL (ref 1.5–2.5)

## 2014-12-27 LAB — CULTURE, RESPIRATORY W GRAM STAIN

## 2014-12-27 LAB — CULTURE, RESPIRATORY

## 2014-12-27 LAB — CBC
HEMATOCRIT: 26.8 % — AB (ref 39.0–52.0)
HEMOGLOBIN: 8.7 g/dL — AB (ref 13.0–17.0)
MCH: 29.7 pg (ref 26.0–34.0)
MCHC: 32.5 g/dL (ref 30.0–36.0)
MCV: 91.5 fL (ref 78.0–100.0)
Platelets: 75 10*3/uL — ABNORMAL LOW (ref 150–400)
RBC: 2.93 MIL/uL — ABNORMAL LOW (ref 4.22–5.81)
RDW: 14.7 % (ref 11.5–15.5)
WBC: 2.5 10*3/uL — AB (ref 4.0–10.5)

## 2014-12-27 LAB — AMMONIA: Ammonia: 41 umol/L — ABNORMAL HIGH (ref 11–32)

## 2014-12-27 MED ORDER — PNEUMOCOCCAL VAC POLYVALENT 25 MCG/0.5ML IJ INJ
0.5000 mL | INJECTION | INTRAMUSCULAR | Status: AC
  Administered 2014-12-28: 0.5 mL via INTRAMUSCULAR
  Filled 2014-12-27: qty 0.5

## 2014-12-27 MED ORDER — SODIUM CHLORIDE 0.9 % IR SOLN
3000.0000 mL | Status: DC
  Administered 2014-12-27: 3000 mL

## 2014-12-27 MED ORDER — SODIUM CHLORIDE 0.9 % IR SOLN
3000.0000 mL | Status: DC
  Administered 2014-12-25 – 2014-12-31 (×34): 3000 mL

## 2014-12-27 MED ORDER — SODIUM CHLORIDE 0.9 % IV SOLN
250.0000 mL | INTRAVENOUS | Status: DC
  Administered 2014-12-27: 250 mL via INTRAVENOUS
  Administered 2014-12-29: 50 mL/h via INTRAVENOUS

## 2014-12-27 MED ORDER — INFLUENZA VAC SPLIT QUAD 0.5 ML IM SUSY
0.5000 mL | PREFILLED_SYRINGE | INTRAMUSCULAR | Status: AC
  Administered 2014-12-28: 0.5 mL via INTRAMUSCULAR
  Filled 2014-12-27: qty 0.5

## 2014-12-27 MED ORDER — VALPROIC ACID 250 MG/5ML PO SYRP
125.0000 mg | ORAL_SOLUTION | Freq: Two times a day (BID) | ORAL | Status: DC
  Administered 2014-12-27 – 2014-12-29 (×5): 125 mg
  Filled 2014-12-27 (×6): qty 2.5

## 2014-12-27 MED ORDER — PANTOPRAZOLE SODIUM 40 MG PO PACK
40.0000 mg | PACK | ORAL | Status: DC
  Administered 2014-12-27 – 2014-12-31 (×5): 40 mg
  Filled 2014-12-27 (×6): qty 20

## 2014-12-27 MED ORDER — LACTULOSE 10 GM/15ML PO SOLN
30.0000 g | Freq: Two times a day (BID) | ORAL | Status: DC
  Administered 2014-12-27 – 2014-12-28 (×4): 30 g
  Filled 2014-12-27 (×6): qty 45

## 2014-12-27 MED ORDER — LISINOPRIL 5 MG PO TABS
5.0000 mg | ORAL_TABLET | Freq: Every day | ORAL | Status: DC
  Administered 2014-12-27 – 2014-12-28 (×2): 5 mg via ORAL
  Filled 2014-12-27 (×2): qty 1

## 2014-12-27 MED ORDER — HYDRALAZINE HCL 20 MG/ML IJ SOLN: 10.0000 mg | Freq: Three times a day (TID) | INTRAMUSCULAR | Status: DC | PRN
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
## 2014-12-27 NOTE — Progress Notes (Addendum)
PULMONARY / CRITICAL CARE MEDICINE HISTORY AND PHYSICAL EXAMINATION   Name: Antonio EpsteinJack Henriques MRN: [id number] DOB: 1960/11/04    ADMISSION DATE:  12/24/2014  PRIMARY SERVICE: PCCM  CHIEF COMPLAINT: Penile bleeding  BRIEF PATIENT DESCRIPTION: 53yom with H/o TBI with trach and PEG, in an LTACH, presents with profuse penile bleeding in setting of recent chronic foley change.  SIGNIFICANT EVENTS:  STUDIES:  4/02 CT abd/pelvis >> bladder filled with blood, CBD at 8 mm, basilar ASD, splenomegaly, cirrhosis 4/02 CT head >> Lt temporal hypodense area, Rt posterior temporal lobe encephalomalacia  SUBJECTIVE:  Unresponsive  VITAL SIGNS: Temp:  [96.9 F (36.1 C)-98.1 F (36.7 C)] 97.9 F (36.6 C) (04/04 0400) Pulse Rate:  [44-79] 67 (04/04 0700) Resp:  [12-23] 20 (04/04 0700) BP: (104-192)/(49-128) 120/85 mmHg (04/04 0700) SpO2:  [96 %-100 %] 100 % (04/04 0700) FiO2 (%):  [28 %] 28 % (04/04 0400) Weight:  [200 lb 13.4 oz (91.1 kg)] 200 lb 13.4 oz (91.1 kg) (04/04 0359) HEMODYNAMICS: CVP:  [6 mmHg-11 mmHg] 7 mmHg VENTILATOR SETTINGS: Vent Mode:  [-]  FiO2 (%):  [28 %] 28 % INTAKE / OUTPUT: Intake/Output      04/03 0701 - 04/04 0700 04/04 0701 - 04/05 0700   I.V. (mL/kg) 2565.1 (28.2)    Other 12900    NG/GT 60    IV Piggyback 601    Total Intake(mL/kg) 16126.1 (177)    Urine (mL/kg/hr) 21700 (9.9)    Total Output [id number]     Net -5573.9            PHYSICAL EXAMINATION: General: unresponsive Neuro: RASS -4 HEENT:  Trach site clean Cardiovascular: regular Lungs: scattered rhonchi Abdomen:  Soft, PEG tube in place Musculoskeletal:  No edema Skin:  Warm, no rash  LABS:  CBC  Recent Labs Lab 12/25/14 2343 12/26/14 0300 12/27/14 0415  WBC 3.2* 3.0* 2.5*  HGB 7.8* 9.2* 8.7*  HCT 23.6* 27.8* 26.8*  PLT 72* 73* 75*   Coag's  Recent Labs Lab 12/24/14 2157 12/24/14 2335 12/25/14 0711  APTT  --  33 34  INR 1.16  --  1.22   BMET  Recent Labs Lab 12/25/14 0711  12/26/14 0300 12/27/14 0415  NA 138 141 146*  K 4.0 3.9 3.6  CL 103 105 109  CO2 28 28 30   BUN <5* 29* 13  CREATININE 1.80* 0.67 0.51  GLUCOSE 92 94 124*   Electrolytes  Recent Labs Lab 12/25/14 0711 12/26/14 0300 12/27/14 0415  CALCIUM 7.3* 8.6 8.4  MG  --   --  1.7  PHOS  --   --  3.3   Sepsis Markers  Recent Labs Lab 12/24/14 2205 12/25/14 0711 12/25/14 0713  LATICACIDVEN 1.55  --  0.7  PROCALCITON  --  0.23  --    ABG No results for input(s): PHART, PCO2ART, PO2ART in the last 168 hours.   Liver Enzymes  Recent Labs Lab 12/25/14 0711  AST 31  ALT 33  ALKPHOS 77  BILITOT 1.0  ALBUMIN 2.4*   Cardiac Enzymes  Recent Labs Lab 12/24/14 2157  TROPONINI 0.03   Glucose  Recent Labs Lab 12/24/14 2136  GLUCAP 92    Imaging Dg Chest Port 1 View  12/27/2014   CLINICAL DATA:  Respiratory failure .  EXAM: PORTABLE CHEST - 1 VIEW  COMPARISON:  12/25/2014.  FINDINGS: Tracheostomy tube, NG tube, left subclavian line in stable position. Stable cardiomegaly. Persistent bibasilar atelectasis. Mild infiltrate right upper lobe cannot be excluded.  No prominent pleural effusion. No pneumothorax. Stable cardiomegaly with normal pulmonary vascularity.  IMPRESSION: 1. Lines and tubes in stable position. 2. Persistent bibasilar atelectasis. Mild infiltrate in the right upper lobe cannot be excluded. 3. Stable cardiomegaly.  No pulmonary venous congestion.   Electronically Signed   By: Maisie Fus  Register   On: 12/27/2014 07:13   Dg Abd Portable 1v  12/25/2014   CLINICAL DATA:  Evaluate nasojejunal tube.  EXAM: PORTABLE ABDOMEN - 1 VIEW  COMPARISON:  CT of earlier today.  FINDINGS: Presumed nasogastric tube terminates at the body of the stomach with side port in the proximal stomach. IVC filter identified.  Non-obstructive bowel gas pattern. Pelvis excluded. Cardiomegaly. Right hemidiaphragm elevation.  IMPRESSION: Presumed nasogastric tube terminating at the body of the stomach.    Electronically Signed   By: Jeronimo Greaves M.D.   On: 12/25/2014 17:11    ASSESSMENT / PLAN:  PULMONARY A: Hx of tracheostomy. P:   Monitor respiratory status  CARDIOVASCULAR Lt [place] CVL 4/02 >> A: Hemorrhagic shock 2nd to GU bleeding >> resolved. Hx of HTN. Hx of IVC filter. P:   Per primary team Hold eliquis  RENAL A: AKI, suspect postobstructive >> resolved. Hematuria >> likely from foley trauma. P:   CBI per urology  GASTROINTESTINAL A: Dysphagia s/p PEG. Changes of cirrhosis on CT abd/pelvis with hx of ETOH. Enlarged CBD on CT abd/pelvis. P:   F/u LFT Primary team to address placement of PEG  HEMATOLOGIC A: GU bleeding. Thrombocytopenia. P:   F/u CBC Transfuse for Hb < 7 or bleeding  INFECTIOUS A: Aspiration PNA. P:   Day 3 of vancomycin, cefepime  4/2 sputum>> 4/2 bc x 2>>  ENDOCRINE A: No acute issues P:   Monitor blood sugar on BMET  NEUROLOGIC A: Acute encephalopathy. Hx of TBI, seizures. H/o seizure d/o P:   Precedex for RASS goal 0 Check ammonia level Add lactulose 4/04 Continue abilify, baclofen, morphine, zoloft, depacon, phenobarb Might need psychiatry evaluation  CC time 35 minutes.  [name], MD [facility] Pulmonary/Critical Care 12/27/2014, 9:51 AM Pager:  [phone] After 3pm call: [phone]

## 2014-12-27 NOTE — Progress Notes (Signed)
PROGRESS NOTE  Antonio Tyler ZOX:[id number] DOB: March 27, 1961 DOA: 12/24/2014 PCP: No primary care provider on file.  HPI/Recap of past 24 hours:  In 4point restrain, intermittent agitation. bp elevated today. Per RN, minimal airway secretion, no diarrhea, no skin problem.  Assessment/Plan: Principal Problem:   Hemorrhagic shock Active Problems:   Shock   TBI (traumatic brain injury)   SAH (subarachnoid hemorrhage)   Abnormal CT scan, head   Acute blood loss anemia   Bladder injury   S/P PICC central line placement, tracking in right IJ,    Hematuria   Seizure disorder   Tracheostomy in place   HCAP (healthcare-associated pneumonia)   Chronic pain syndrome   H/O ETOH abuse   Bedridden   h/o Enterococcal bacteremia   Psychomotor agitation   Dysphagia     Hemorrhagic/septic shock: improving on ivf/ax/prbc transfusion/prn pressor. PCCM following   HCAP? On broad spectrum abx, s/p trach, on 5liter currently.  HTN: bp meds on hold initially due to hypotension/arf, also has bradycardia initially.  bp elevated on 4/4, cr normalized, restart lisinopril, add hydralazine prn for now.   Hematuria/urethral bleed: urology following on CBI.  S/p peg removal by patient: Ir to replace PEG on 4/5, currently has NG tube.  Agitation/h/o seizure: four point restrain, abilify/phenobarbital/dpacon. Neuro following.  H/o TBI/alcohol abuse: s/p peg/trach. Admitted from [facility]Amin [phone] reported that patient has been needing restrain 24/7 at their facility.   H/o IVC filter, on chronic eliquis, eliquis on hold due to hematuria.   Code Status: full  Family Communication: patient  Disposition Plan: remain inpatient   Consultants:  PCCM/neuro/urology  Procedures:  Peg placement/picc placement  Antibiotics:  vanc/cefepime   Objective: BP 174/88 mmHg  Pulse 72  Temp(Src) 99 F (37.2 C) (Oral)  Resp 23  Ht  (1.727 m)  Wt 91.1 kg (200 lb 13.4 oz)  BMI  30.54 kg/m2  SpO2 100%  Intake/Output Summary (Last 24 hours) at 12/27/14 1849 Last data filed at 12/27/14 1800  Gross per 24 hour  Intake 8585.87 ml  Output  81191 ml  Net -8664.13 ml   Filed Weights   12/25/14 0200 12/26/14 0445 12/27/14 0359  Weight: 91.5 kg (201 lb 11.5 oz) 90.3 kg (199 lb 1.2 oz) 91.1 kg (200 lb 13.4 oz)    Exam:   General:  , in restrain.   Cardiovascular: RRR  Respiratory: diminished at bases  Abdomen: Soft/ND/NT, decreased BS  Musculoskeletal: No Edema  Neuro: Minimal tracking, non verbal, not following command, intermittent agitation  Data Reviewed: Basic Metabolic Panel:  Recent Labs Lab 12/24/14 2157 12/25/14 0711 12/26/14 0300 12/27/14 0415  NA 133* 138 141 146*  K 4.7 4.0 3.9 3.6  CL 93* 103 105 109  CO2 35* GLUCOSE 105* 92 94 124*  BUN 59* <5* 29* 13  CREATININE 4.07* 1.80* 0.67 0.51  CALCIUM 8.7 7.3* 8.6 8.4  MG  --   --   --  1.7  PHOS  --   --   --  3.3   Liver Function Tests:  Recent Labs Lab 12/25/14 0711 12/27/14 0955  AST 31 37  ALT 33 35  ALKPHOS 77 84  BILITOT 1.0 0.8  PROT 5.7* 5.8*  ALBUMIN 2.4* 2.6*   No results for input(s): LIPASE, AMYLASE in the last 168 hours.  Recent Labs Lab 12/27/14 0954  AMMONIA 41*   CBC:  Recent Labs Lab 12/24/14 2157 12/25/14 0711 12/25/14 2343 12/26/14  0300 12/27/14 0415  WBC 8.7 5.1 3.2* 3.0* 2.5*  NEUTROABS 6.2 3.6  --   --   --   HGB 10.1* 8.5* 7.8* 9.2* 8.7*  HCT 30.7* 25.7* 23.6* 27.8* 26.8*  MCV 93.0 91.1 92.5 90.8 91.5  PLT 133* 86* 72* 73* 75*   Cardiac Enzymes:    Recent Labs Lab 12/24/14 2157  TROPONINI 0.03   BNP (last 3 results) No results for input(s): BNP in the last 8760 hours.  ProBNP (last 3 results) No results for input(s): PROBNP in the last 8760 hours.  CBG:  Recent Labs Lab 12/24/14 2136  GLUCAP 92    Recent Results (from the past 240 hour(s))  Blood culture (routine x 2)     Status: None (Preliminary result)    Collection Time: 12/24/14 11:35 PM  Result Value Ref Range Status   Specimen Description BLOOD LEFT HAND  Final   Special Requests BOTTLES DRAWN AEROBIC ONLY 8CC  Final   Culture   Final           BLOOD CULTURE RECEIVED NO GROWTH TO DATE CULTURE WILL BE HELD FOR 5 DAYS BEFORE ISSUING A FINAL NEGATIVE REPORT Performed at [facility]    Report Status PENDING  Incomplete  Blood culture (routine x 2)     Status: None (Preliminary result)   Collection Time: 12/25/14 12:34 AM  Result Value Ref Range Status   Specimen Description BLOOD LEFT HAND  Final   Special Requests BOTTLES DRAWN AEROBIC AND ANAEROBIC 5CC  Final   Culture   Final           BLOOD CULTURE RECEIVED NO GROWTH TO DATE CULTURE WILL BE HELD FOR 5 DAYS BEFORE ISSUING A FINAL NEGATIVE REPORT Performed at [facility]    Report Status PENDING  Incomplete  Culture, respiratory (NON-Expectorated)     Status: None   Collection Time: 12/25/14  3:33 AM  Result Value Ref Range Status   Specimen Description TRACHEAL ASPIRATE  Final   Special Requests NONE  Final   Gram Stain   Final    MODERATE WBC PRESENT,BOTH PMN AND MONONUCLEAR FEW SQUAMOUS EPITHELIAL CELLS PRESENT MODERATE GRAM NEGATIVE RODS RARE GRAM POSITIVE RODS Performed at [facility]    Culture   Final    MODERATE HAEMOPHILUS PARAINFLUENZAE Note: BETA LACTAMASE POSITIVE Performed at [facility]    Report Status 12/27/2014 FINAL  Final     Studies: No results found.  Scheduled Meds: . ARIPiprazole  10 mg Per Tube Q12H  . baclofen  10 mg Oral Q12H  . ceFEPime (MAXIPIME) IV  1 g Intravenous 3 times per day  . chlorhexidine  15 mL Mouth/Throat Q6H  . [START ON 12/28/2014] Influenza vac split quadrivalent PF  0.5 mL Intramuscular Tomorrow-1000  . lactulose  30 g Per Tube BID  . morphine  30 mg Oral Q6H  . pantoprazole sodium  40 mg Per Tube Q24H  . PHENObarbital  32.5 mg Intravenous 3 times per day  . [START ON 12/28/2014]  pneumococcal 23 valent vaccine  0.5 mL Intramuscular Tomorrow-1000  . sertraline  100 mg Per Tube Daily  . thiamine  100 mg Per Tube Daily  . Valproic Acid  125 mg Per Tube BID  . vancomycin  1,000 mg Intravenous Q8H    Continuous Infusions: . sodium chloride 250 mL (12/27/14 1100)  . dexmedetomidine 0.599 mcg/kg/hr (12/27/14 1500)     Time spent: >2mins  Kahne Helfand MD, PhD  [facility] Pager [phone]. If 7PM-7AM, please contact night-coverage at [email or web address], password [email or web address] 12/27/2014, 6:49 PM  LOS: 2 days

## 2014-12-27 NOTE — Progress Notes (Signed)
Urology Progress Note   Urology Attending Note   Subjective: Iatrogenic Prostatic Urethral trauma, slow to clear, requiring continued CBI, at slow drip.     No acute urologic events overnight. Ambulation:   negative Flatus:    negative Bowel movement  negative  Pain: some relief  Objective:  Blood pressure 174/88, pulse 72, temperature 99 F (37.2 C), temperature source Oral, resp. rate 23, height 5\' 8"  (1.727 m), weight 91.1 kg (200 lb 13.4 oz), SpO2 100 %.  Physical Exam:  General:  No acute distress, awake  Genitourinary:  Normal penis and scrotum Foley: 3-way remains in place    I/O last 3 completed shifts: In: 45064.8 [I.V.:3942.5; Other:39900; NG/GT:120; IV Piggyback:1102.3] Out: [id number] [Urine:48250]  Recent Labs     12/26/14  0300  12/27/14  0415  HGB  9.2*  8.7*  WBC  3.0*  2.5*  PLT  73*  75*    Recent Labs     12/26/14  0300  12/27/14  0415  NA  141  146*  K  3.9  3.6  CL  105  109  CO2  28  30  BUN  29*  13  CREATININE  0.67  0.51  CALCIUM  8.6  8.4  GFRNONAA  >90  >90  GFRAA  >90  >90     Recent Labs     12/24/14  2157  12/24/14  2335  12/25/14  0711  INR  1.16   --   1.22  APTT   --   33  34     Invalid input(s): ABG  Assessment/Plan: Discussed wit RN and Dr. Laural BenesJohnson. Agree with his Assessment and plan.  Catheter not removed. Continue any current medications.

## 2014-12-28 DIAGNOSIS — S069X0S Unspecified intracranial injury without loss of consciousness, sequela: Secondary | ICD-10-CM

## 2014-12-28 DIAGNOSIS — R6521 Severe sepsis with septic shock: Secondary | ICD-10-CM | POA: Diagnosis not present

## 2014-12-28 DIAGNOSIS — R451 Restlessness and agitation: Secondary | ICD-10-CM | POA: Diagnosis present

## 2014-12-28 LAB — FERRITIN: FERRITIN: 371 ng/mL — AB (ref 22–322)

## 2014-12-28 LAB — BASIC METABOLIC PANEL
ANION GAP: 5 (ref 5–15)
BUN: 10 mg/dL (ref 6–23)
CHLORIDE: 111 mmol/L (ref 96–112)
CO2: 29 mmol/L (ref 19–32)
Calcium: 8.3 mg/dL — ABNORMAL LOW (ref 8.4–10.5)
Creatinine, Ser: 0.99 mg/dL (ref 0.50–1.35)
GFR calc Af Amer: >90 mL/min (ref >90)
GFR calc non Af Amer: >90 mL/min (ref >90)
Glucose, Bld: 110 mg/dL — ABNORMAL HIGH (ref 70–99)
Potassium: 3.6 mmol/L (ref 3.5–5.1)
SODIUM: 145 mmol/L (ref 135–145)

## 2014-12-28 LAB — CBC
HCT: 21.1 % — ABNORMAL LOW (ref 39.0–52.0)
Hemoglobin: 7.1 g/dL — ABNORMAL LOW (ref 13.0–17.0)
MCH: 30.6 pg (ref 26.0–34.0)
MCHC: 33.6 g/dL (ref 30.0–36.0)
MCV: 90.9 fL (ref 78.0–100.0)
PLATELETS: 128 10*3/uL — AB (ref 150–400)
RBC: 2.32 MIL/uL — ABNORMAL LOW (ref 4.22–5.81)
RDW: 14.3 % (ref 11.5–15.5)
WBC: 5.6 10*3/uL (ref 4.0–10.5)

## 2014-12-28 LAB — IRON AND TIBC
IRON: 43 ug/dL (ref 42–165)
SATURATION RATIOS: 19 % — AB (ref 20–55)
TIBC: 230 ug/dL (ref 215–435)
UIBC: 187 ug/dL (ref 125–400)

## 2014-12-28 LAB — PREPARE RBC (CROSSMATCH)

## 2014-12-28 MED ORDER — CHLORHEXIDINE GLUCONATE 0.12 % MT SOLN
15.0000 mL | Freq: Two times a day (BID) | OROMUCOSAL | Status: DC
  Administered 2014-12-28 – 2015-01-05 (×15): 15 mL via OROMUCOSAL
  Filled 2014-12-28 (×15): qty 15

## 2014-12-28 MED ORDER — CEFTRIAXONE SODIUM IN DEXTROSE 20 MG/ML IV SOLN
1.0000 g | Freq: Every day | INTRAVENOUS | Status: DC
  Administered 2014-12-28 – 2014-12-31 (×4): 1 g via INTRAVENOUS
  Filled 2014-12-28 (×4): qty 50

## 2014-12-28 MED ORDER — SODIUM CHLORIDE 0.9 % IV SOLN: Freq: Once | INTRAVENOUS | Status: DC

## 2014-12-28 MED ORDER — CETYLPYRIDINIUM CHLORIDE 0.05 % MT LIQD
7.0000 mL | Freq: Two times a day (BID) | OROMUCOSAL | Status: DC
  Administered 2014-12-28 – 2015-01-05 (×16): 7 mL via OROMUCOSAL

## 2014-12-28 NOTE — Evaluation (Signed)
[facility] Speaking [facility]ve - Evaluation Patient Details  Name: Antonio Tyler Febo MRN: [id number] Date of Birth: 18-Dec-1960  Today's Date: 12/28/2014 Time: 1002-1015 SLP Time Calculation (min) (ACUTE ONLY): 13 min  Past Medical History:  Past Medical History  Diagnosis Date  . Hypertension   . Seizures   . Anxiety   . Traumatic brain injury   . Alcohol abuse    Past Surgical History: No past surgical history on file. HPI:  Pt is a 54 y/o male with PMH of TBI, SAH, subdural hemorrhage, cerebral contusion, HTN, alcohol abuse, tracheostomy status, PEG tube status, multiple rib fractures, T1 fracture, enterococcal bacteremia, and IVC filter placement. Pt brought in from [facility] with chronic profuse penile bleeding followng chronic foley change.    Assessment / Plan / Recommendation Clinical Impression   Pt was seen for PMSV evaluation. Pt was alert and agitated at times. Pt wore PMSV for 10 minutes without evidence of air trapping; O2 99; RR 15-20. Pt able to phonate, however, speech unintelligible and of low intensity resulting from reduced respiratory support for speech. Intelligibility impacted by pt's baseline cognitive deficits. Pt unable to follow verbal commands to increase vocal intensity despite max cues from SLP. Per RN report, pt with increased secretion production requiring suctioning every hour. Recommend pt wear PMSV with ST only. Speech will follow-up with PMSV treatment.     SLP Assessment  Patient needs continued Speech Lanaguage Pathology Services    Follow Up Recommendations  Skilled Nursing facility    Frequency and Duration min 2x/week  2 weeks   Pertinent Vitals/Pain     SLP Goals Potential to Achieve Goals (ACUTE ONLY): Fair Potential Considerations (ACUTE ONLY): Severity of impairments;Cooperation/participation level   PMSV Trial  PMSV was placed for: 10 Able to redirect subglottic air through upper airway: Yes Able to Attain Phonation: Yes Voice Quality: Low  vocal intensity;Hoarse Able to Expectorate Secretions: No Level of Secretion Expectoration with PMSV: Not observed Breath Support for Phonation: Mildly decreased Intelligibility: Intelligibility reduced Word: 50-74% accurate Phrase: 25-49% accurate Sentence: 0-24% accurate Conversation: 0-24% accurate Respirations During Trial: 15 SpO2 During Trial: 99 % Behavior: Alert;Anxious;Confused   Tracheostomy Tube  Additional Tracheostomy Tube Assessment Fenestrated: No Secretion Description: White. Moderately thick Frequency of Tracheal Suctioning: every hour    Vent Dependency  FiO2 (%): 28 %    Cuff Deflation Trial Tolerated Cuff Deflation: Yes Length of Time for Cuff Deflation Trial: 10 Behavior: Alert;Anxious;Confused   Bermudez-Bosch, Antonio Tyler 12/28/2014, 11:21 AM

## 2014-12-28 NOTE — Progress Notes (Signed)
PROGRESS NOTE  Antonio Tyler ZOX:[id number] DOB: [birth year] DOA: 12/24/2014 PCP: No primary care provider on file.  HPI/Recap of past 24 hours:  Has brief hypotension/bradycardia last night, improved with decreased dose of precedex. hgb dropped thia am. In 4point restrain, intermittent agitation. Visible hematuria in foley bag. Per RN, minimal airway secretion, no diarrhea, no skin problem.  Assessment/Plan: Principal Problem:   Hemorrhagic shock Active Problems:   Shock   TBI (traumatic brain injury)   SAH (subarachnoid hemorrhage)   Abnormal CT scan, head   Acute blood loss anemia   Bladder injury   S/P PICC central line placement, tracking in right IJ,    Hematuria   Seizure disorder   Tracheostomy in place   HCAP (healthcare-associated pneumonia)   Chronic pain syndrome   H/O ETOH abuse   Bedridden   h/o Enterococcal bacteremia   Psychomotor agitation   Dysphagia    shock: presented with hypotension/GU bleed. Shock likely mostly hemorrhagic, but infection could also contribute, does seem to have mild pna on CT chest, sputum culture +MODERATE HAEMOPHILUS PARAINFLUENZAE (BETA LACTAMASE POSITIVE)  improving on ivf/prbc transfusion/abx/prn pressor. PCCM following  ARF: resolving. From GU bleed/hypotension. Monitor renal function.  Blood loss anemia: s/p prbc transfusion on admission. hgb 7.1 on 4/5, transfuse prbc x1 on 4/5.  Hematuria/urethral bleed: urology following, on CBI. Still visible hematuria/occasional clots.  HCAP?, s/p trach PTA, on 5liter currently. On broad spectrum abx initially, sputum culture result as above, abx changed to rocephin from 4/5.    Agitation/h/o seizure: four point restrain, abilify/phenobarbital/valproic acid/precedex. Patient is too agitated to have gone through MTI brain test, Neuro signed off. Consider repeat MRI brain if less agitation.  patient pulled out his PEG  tube Nutrition per Ng tube now, nutrition team consult to start ng tube  feeds. Discussed with IR Dr. Grace Isaac, he reported that previous PEG tube did extend through the inferior edge of the liver and he recommend a brand new placement in a lower location without liver involvement, likely a week from now to allow old tract to heal.  In regarding to inferior edge of the liver, he reported that it is minor, states No further intervention needed. IR to replace PEG in a week from 4/5   H/o TBI/alcohol abuse: s/p peg/trach. Admitted from [facility] (Dr. Nelson Chimes [phone] reported that patient has been needing restrain 24/7 at their facility.)   H/o IVC filter, on chronic eliquis, eliquis on hold due to hematuria.  H/o HTN:  bp meds on hold initially due to hypotension/arf, also has bradycardia initially.   bp elevated on 4/4, cr normalized, restart lisinopril, added hydralazine prn for now.  bp low with intermittent bradycardia overnight on 4/4-4/5, improved with lower dose on precedex, d/c lisinopril on 4/5, reeval bp control need daily  Code Status: full  Family Communication: patient  Disposition Plan: remain in stepdown   Consultants:  PCCM/neuro/urology  Procedures: picc placement  Antibiotics:  Vanc/cefepime from admission to 4/4.  Rocephin from 4/5   Objective: BP 123/73 mmHg  Pulse 86  Temp(Src) 99.2 F (37.3 C) (Oral)  Resp 20  Ht  (1.727 m)  Wt 87.7 kg (193 lb 5.5 oz)  BMI 29.40 kg/m2  SpO2 100%  Intake/Output Summary (Last 24 hours) at 12/28/14 1740 Last data filed at 12/28/14 1700  Gross per 24 hour  Intake 29554.19 ml  Output  82956 ml  Net -1395.81 ml   Filed Weights   12/26/14 0445 12/27/14 0359 12/28/14  0415  Weight: 90.3 kg (199 lb 1.2 oz) 91.1 kg (200 lb 13.4 oz) 87.7 kg (193 lb 5.5 oz)    Exam:   General:   in restrain.   Cardiovascular: RRR  Respiratory: s/p trach, diminished at bases,   Abdomen: Soft/ND/NT, + BS  Musculoskeletal: No Edema  Neuro: Minimal tracking, non verbal, not following  command, intermittent agitation  Data Reviewed: Basic Metabolic Panel:  Recent Labs Lab 12/24/14 2157 12/25/14 0711 12/26/14 0300 12/27/14 0415 12/28/14 0215  NA 133* 138 141 146* 145  K 4.7 4.0 3.9 3.6 3.6  CL 93* 103 105 109 111  CO2 35* GLUCOSE 105* 92 94 124* 110*  BUN 59* <5* 29* 13 10  CREATININE 4.07* 1.80* 0.67 0.51 0.99  CALCIUM 8.7 7.3* 8.6 8.4 8.3*  MG  --   --   --  1.7  --   PHOS  --   --   --  3.3  --    Liver Function Tests:  Recent Labs Lab 12/25/14 0711 12/27/14 0955  AST 31 37  ALT 33 35  ALKPHOS 77 84  BILITOT 1.0 0.8  PROT 5.7* 5.8*  ALBUMIN 2.4* 2.6*   No results for input(s): LIPASE, AMYLASE in the last 168 hours.  Recent Labs Lab 12/27/14 0954  AMMONIA 41*   CBC:  Recent Labs Lab 12/24/14 2157 12/25/14 0711 12/25/14 2343 12/26/14 0300 12/27/14 0415 12/28/14 0215  WBC 8.7 5.1 3.2* 3.0* 2.5* 5.6  NEUTROABS 6.2 3.6  --   --   --   --   HGB 10.1* 8.5* 7.8* 9.2* 8.7* 7.1*  HCT 30.7* 25.7* 23.6* 27.8* 26.8* 21.1*  MCV 93.0 91.1 92.5 90.8 91.5 90.9  PLT 133* 86* 72* 73* 75* 128*   Cardiac Enzymes:    Recent Labs Lab 12/24/14 2157  TROPONINI 0.03   BNP (last 3 results) No results for input(s): BNP in the last 8760 hours.  ProBNP (last 3 results) No results for input(s): PROBNP in the last 8760 hours.  CBG:  Recent Labs Lab 12/24/14 2136  GLUCAP 92    Recent Results (from the past 240 hour(s))  Blood culture (routine x 2)     Status: None (Preliminary result)   Collection Time: 12/24/14 11:35 PM  Result Value Ref Range Status   Specimen Description BLOOD LEFT HAND  Final   Special Requests BOTTLES DRAWN AEROBIC ONLY 8CC  Final   Culture   Final           BLOOD CULTURE RECEIVED NO GROWTH TO DATE CULTURE WILL BE HELD FOR 5 DAYS BEFORE ISSUING A FINAL NEGATIVE REPORT Performed at [facility]    Report Status PENDING  Incomplete  Blood culture (routine x 2)     Status: None (Preliminary result)    Collection Time: 12/25/14 12:34 AM  Result Value Ref Range Status   Specimen Description BLOOD LEFT HAND  Final   Special Requests BOTTLES DRAWN AEROBIC AND ANAEROBIC 5CC  Final   Culture   Final           BLOOD CULTURE RECEIVED NO GROWTH TO DATE CULTURE WILL BE HELD FOR 5 DAYS BEFORE ISSUING A FINAL NEGATIVE REPORT Performed at [facility]    Report Status PENDING  Incomplete  Culture, respiratory (NON-Expectorated)     Status: None   Collection Time: 12/25/14  3:33 AM  Result Value Ref Range Status   Specimen Description TRACHEAL ASPIRATE  Final  Special Requests NONE  Final   Gram Stain   Final    MODERATE WBC PRESENT,BOTH PMN AND MONONUCLEAR FEW SQUAMOUS EPITHELIAL CELLS PRESENT MODERATE GRAM NEGATIVE RODS RARE GRAM POSITIVE RODS [facility]    Culture   Final    MODERATE HAEMOPHILUS PARAINFLUENZAE Note: BETA LACTAMASE POSITIVE [facility] at [facility]    Report Status 12/27/2014 FINAL  Final     Studies: Dg Chest Port 1 View  12/27/2014   CLINICAL DATA:  Respiratory failure .  EXAM: PORTABLE CHEST - 1 VIEW  COMPARISON:  12/25/2014.  FINDINGS: Tracheostomy tube, NG tube, left subclavian line in stable position. Stable cardiomegaly. Persistent bibasilar atelectasis. Mild infiltrate right upper lobe cannot be excluded. No prominent pleural effusion. No pneumothorax. Stable cardiomegaly with normal pulmonary vascularity.  IMPRESSION: 1. Lines and tubes in stable position. 2. Persistent bibasilar atelectasis. Mild infiltrate in the right upper lobe cannot be excluded. 3. Stable cardiomegaly.  No pulmonary venous congestion.   Electronically Signed   By: Maisie Fushomas  Register   On: 12/27/2014 07:13   Dg Abd Portable 1v  12/27/2014   CLINICAL DATA:  NG tube insertion.  EXAM: PORTABLE ABDOMEN - 1 VIEW  COMPARISON:  12/25/2014.  FINDINGS: NG tube noted with its tip in the upper portion stomach. IVC filter noted with its tip at the L1-L2 level. No  bowel distention. No free air. Mild right base atelectasis. Cardiomegaly.  IMPRESSION: 1. NG tube noted with its tip in the upper portion stomach. No bowel distention.  2.  Mild right base atelectasis.  Cardiomegaly.   Electronically Signed   By: Maisie Fushomas  Register   On: 12/27/2014 16:51    Scheduled Meds: . sodium chloride   Intravenous Once  . antiseptic oral rinse  7 mL Mouth Rinse q12n4p  . ARIPiprazole  10 mg Per Tube Q12H  . baclofen  10 mg Oral Q12H  . cefTRIAXone (ROCEPHIN)  IV  1 g Intravenous Daily  . chlorhexidine  15 mL Mouth Rinse BID  . lactulose  30 g Per Tube BID  . morphine  30 mg Oral Q6H  . pantoprazole sodium  40 mg Per Tube Q24H  . PHENObarbital  32.5 mg Intravenous 3 times per day  . sertraline  100 mg Per Tube Daily  . thiamine  100 mg Per Tube Daily  . Valproic Acid  125 mg Per Tube BID    Continuous Infusions: . sodium chloride 250 mL (12/27/14 1900)  . dexmedetomidine 0.3 mcg/kg/hr (12/28/14 1700)  . sodium chloride irrigation       Time spent: >3035mins  Aasiya Creasey MD, PhD  [facility] Pager [phone]. If 7PM-7AM, please contact night-coverage at [email or web address], password [email or web address] 12/28/2014, 5:40 PM  LOS: 3 days

## 2014-12-28 NOTE — Progress Notes (Signed)
PULMONARY / CRITICAL CARE MEDICINE HISTORY AND PHYSICAL EXAMINATION   Name: Antonio EpsteinJack Tyler MRN: [id number] DOB: October 18, 1960    ADMISSION DATE:  12/24/2014  PRIMARY SERVICE: PCCM  CHIEF COMPLAINT: Penile bleeding  BRIEF PATIENT DESCRIPTION: 53yom with H/o TBI with trach and PEG, in an LTACH, presents with profuse penile bleeding in setting of recent chronic foley change.  SIGNIFICANT EVENTS:  STUDIES:  4/02 CT abd/pelvis >> bladder filled with blood, CBD at 8 mm, basilar ASD, splenomegaly, cirrhosis 4/02 CT head >> Lt temporal hypodense area, Rt posterior temporal lobe encephalomalacia  SUBJECTIVE:  More alert.  VITAL SIGNS: Temp:  [97.7 F (36.5 C)-99.4 F (37.4 C)] 97.7 F (36.5 C) (04/05 0400) Pulse Rate:  [52-101] 99 (04/05 0700) Resp:  [12-32] 32 (04/05 0700) BP: (58-191)/(26-137) 121/70 mmHg (04/05 0700) SpO2:  [80 %-100 %] 100 % (04/05 0700) FiO2 (%):  [28 %] 28 % (04/05 0400) Weight:  [193 lb 5.5 oz (87.7 kg)] 193 lb 5.5 oz (87.7 kg) (04/05 0415) HEMODYNAMICS: CVP:  [8 mmHg] 8 mmHg VENTILATOR SETTINGS: Vent Mode:  [-]  FiO2 (%):  [28 %] 28 % INTAKE / OUTPUT: Intake/Output      04/04 0701 - 04/05 0700 04/05 0701 - 04/06 0700   I.V. (mL/kg) 1643.4 (18.7)    Other 21010 3000   NG/GT 130    IV Piggyback 800    Total Intake(mL/kg) 23583.4 (268.9) 3000 (34.2)   Urine (mL/kg/hr) 25850 (12.3)    Stool 0 (0)    Total Output [id number]     Net -2266.6 +3000        Stool Occurrence 2 x      PHYSICAL EXAMINATION: General: pleasant Neuro: RASS 0 HEENT:  Trach site clean Cardiovascular: regular Lungs: scattered rhonchi Abdomen:  Soft, non tender Musculoskeletal:  No edema Skin:  Warm, no rash  LABS:  CBC  Recent Labs Lab 12/26/14 0300 12/27/14 0415 12/28/14 0215  WBC 3.0* 2.5* 5.6  HGB 9.2* 8.7* 7.1*  HCT 27.8* 26.8* 21.1*  PLT 73* 75* 128*   Coag's  Recent Labs Lab 12/24/14 2157 12/24/14 2335 12/25/14 0711  APTT  --  33 34  INR 1.16  --  1.22    BMET  Recent Labs Lab 12/26/14 0300 12/27/14 0415 12/28/14 0215  NA 141 146* 145  K 3.9 3.6 3.6  CL 105 109 111  CO2 28 30 29   BUN 29* 13 10  CREATININE 0.67 0.51 0.99  GLUCOSE 94 124* 110*   Electrolytes  Recent Labs Lab 12/26/14 0300 12/27/14 0415 12/28/14 0215  CALCIUM 8.6 8.4 8.3*  MG  --  1.7  --   PHOS  --  3.3  --    Sepsis Markers  Recent Labs Lab 12/24/14 2205 12/25/14 0711 12/25/14 0713  LATICACIDVEN 1.55  --  0.7  PROCALCITON  --  0.23  --     Liver Enzymes  Recent Labs Lab 12/25/14 0711 12/27/14 0955  AST 31 37  ALT 33 35  ALKPHOS 77 84  BILITOT 1.0 0.8  ALBUMIN 2.4* 2.6*   Cardiac Enzymes  Recent Labs Lab 12/24/14 2157  TROPONINI 0.03   Glucose  Recent Labs Lab 12/24/14 2136  GLUCAP 92    Imaging Dg Chest Port 1 View  12/27/2014   CLINICAL DATA:  Respiratory failure .  EXAM: PORTABLE CHEST - 1 VIEW  COMPARISON:  12/25/2014.  FINDINGS: Tracheostomy tube, NG tube, left subclavian line in stable position. Stable cardiomegaly. Persistent bibasilar atelectasis. Mild infiltrate right upper  lobe cannot be excluded. No prominent pleural effusion. No pneumothorax. Stable cardiomegaly with normal pulmonary vascularity.  IMPRESSION: 1. Lines and tubes in stable position. 2. Persistent bibasilar atelectasis. Mild infiltrate in the right upper lobe cannot be excluded. 3. Stable cardiomegaly.  No pulmonary venous congestion.   Electronically Signed   By: Maisie Fus  Register   On: 12/27/2014 07:13   Dg Abd Portable 1v  12/27/2014   CLINICAL DATA:  NG tube insertion.  EXAM: PORTABLE ABDOMEN - 1 VIEW  COMPARISON:  12/25/2014.  FINDINGS: NG tube noted with its tip in the upper portion stomach. IVC filter noted with its tip at the L1-L2 level. No bowel distention. No free air. Mild right base atelectasis. Cardiomegaly.  IMPRESSION: 1. NG tube noted with its tip in the upper portion stomach. No bowel distention.  2.  Mild right base atelectasis.   Cardiomegaly.   Electronically Signed   By: Maisie Fus  Register   On: 12/27/2014 16:51    ASSESSMENT / PLAN:  PULMONARY A: Hx of tracheostomy. P:   Monitor respiratory status Speech to assess for PM valve  CARDIOVASCULAR Lt [facility] CVL 4/02 >> A: Hemorrhagic shock 2nd to GU bleeding >> resolved. Hx of HTN. Hx of IVC filter. P:   Continue lisinopril Hold eliquis  RENAL A: AKI, suspect postobstructive >> resolved. Hematuria >> likely from foley trauma. P:   CBI per urology  GASTROINTESTINAL A: Dysphagia s/p PEG >> pt pulled PEG out. Changes of cirrhosis on CT abd/pelvis with hx of ETOH. Enlarged CBD on CT abd/pelvis >> LFT's okay and no abd tenderness. P:   Primary team to address placement of PEG, nutrition  HEMATOLOGIC A: GU bleeding. Thrombocytopenia. P:   F/u CBC Transfuse for Hb < 7 or bleeding Check iron levels  INFECTIOUS A: Aspiration PNA. P:   Day 4/7 >> change to rocephin 4/05 D/c vancomycin, cefepime  4/2 sputum>> Haemophilus parainfluenzae 4/2 bc x 2>>  ENDOCRINE A: No acute issues P:   Monitor blood sugar on BMET  NEUROLOGIC A: Acute encephalopathy >> seems improved 4/05. Hx of TBI, seizures. P:   Precedex for RASS goal 0 Added lactulose 4/04 Continue abilify, baclofen, morphine, zoloft, depacon, phenobarb Might need psychiatry evaluation   [name], MD [facility] Pulmonary/Critical Care 12/28/2014, 7:47 AM Pager:  [phone] After 3pm call: [phone]

## 2014-12-28 NOTE — Care Management Note (Addendum)
    Page 1 of 2   01/06/2015     10:18:39 AM CARE MANAGEMENT NOTE 01/06/2015  Patient:  Antonio Tyler,Antonio Tyler   Account Number:  [id number]  Date Initiated:  12/26/2014  Documentation initiated by:  [facility]  Subjective/Objective Assessment:   Admitted from a facility - for penile bleeding - shock.     Action/Plan:   bacon placed once family memeber arrives from [place] to sign papers   Anticipated DC Date:  01/07/2015   Anticipated [facility]  In-house referral  Clinical Social Worker      DC Planning Services  CM consult      [facility]   Choice offered to / List presented to:             Status of service:  Completed, signed off Medicare Important Message given?   (If response is "NO", the following Medicare IM given date fields will be blank) Date Medicare IM given:   Medicare IM given by:   Date Additional Medicare IM given:   Additional Medicare IM given by:    Discharge Disposition:    Per UR Regulation:  Reviewed for med. necessity/level of care/duration of stay  If discussed at Long Length of Stay Meetings, dates discussed:   12/30/2014  01/04/2015  01/06/2015    Comments:  January 06, 2015/Rhonda L. Earlene Plateravis, RN, BSN, CCM. Case Management [facility] [phone] No discharge needs present of time of review.   January 03, 2015/Rhonda L. Earlene Plateravis, RN, BSN, CCM. Case Management [facility] [phone] No discharge needs present of time of review. will follow up on hospice needs   Contact:  Lighty,John Brother [phone] 4/6 1316 debbie dowell rn,bsn spoke w brother [name] Garcilazo by phone. explained if all goes well pt will be dc [facility] to [facility]. brother had wanted pt to stay at hosp but explained [facility] only fixes immediate problems and [facility] works on long term goals. he states ok and aware will be dc back on 4-7 to [facility]. 4/6 1113 debbie dowell rn,bsn pt for dc back to [facility] on 6-7. have placed cobra form  on shadow chart. alerted andrea w [facility]. they can provide cont bladder irrigation. tried to reach brother [name] but no answer at present.  4/5 1420 debbie dowell rn,bsn pt from [facility] and they will take back at disch.

## 2014-12-29 ENCOUNTER — Inpatient Hospital Stay (HOSPITAL_COMMUNITY): Payer: Medicare Other

## 2014-12-29 ENCOUNTER — Encounter (HOSPITAL_COMMUNITY): Payer: Self-pay | Admitting: Radiology

## 2014-12-29 DIAGNOSIS — A419 Sepsis, unspecified organism: Secondary | ICD-10-CM | POA: Diagnosis present

## 2014-12-29 LAB — CBC
HCT: 21.4 % — ABNORMAL LOW (ref 39.0–52.0)
HCT: 24.3 % — ABNORMAL LOW (ref 39.0–52.0)
HEMOGLOBIN: 8.3 g/dL — AB (ref 13.0–17.0)
Hemoglobin: 7.1 g/dL — ABNORMAL LOW (ref 13.0–17.0)
MCH: 30.5 pg (ref 26.0–34.0)
MCH: 30.9 pg (ref 26.0–34.0)
MCHC: 33.2 g/dL (ref 30.0–36.0)
MCHC: 34.2 g/dL (ref 30.0–36.0)
MCV: 91.8 fL (ref 78.0–100.0)
MCV: 90.3 fL (ref 78.0–100.0)
PLATELETS: 85 10*3/uL — AB (ref 150–400)
Platelets: 111 10*3/uL — ABNORMAL LOW (ref 150–400)
RBC: 2.33 MIL/uL — ABNORMAL LOW (ref 4.22–5.81)
RBC: 2.69 MIL/uL — AB (ref 4.22–5.81)
RDW: 14.8 % (ref 11.5–15.5)
RDW: 15.2 % (ref 11.5–15.5)
WBC: 3.5 10*3/uL — AB (ref 4.0–10.5)
WBC: 7.1 10*3/uL (ref 4.0–10.5)

## 2014-12-29 LAB — CBC WITH DIFFERENTIAL/PLATELET
Basophils Absolute: 0 10*3/uL (ref 0.0–0.1)
Basophils Relative: 0 % (ref 0–1)
EOS ABS: 0.2 10*3/uL (ref 0.0–0.7)
EOS PCT: 4 % (ref 0–5)
HCT: 19.3 % — ABNORMAL LOW (ref 39.0–52.0)
Hemoglobin: 6.4 g/dL — CL (ref 13.0–17.0)
LYMPHS ABS: 1 10*3/uL (ref 0.7–4.0)
Lymphocytes Relative: 22 % (ref 12–46)
MCH: 30.3 pg (ref 26.0–34.0)
MCHC: 33.2 g/dL (ref 30.0–36.0)
MCV: 91.5 fL (ref 78.0–100.0)
Monocytes Absolute: 0.3 10*3/uL (ref 0.1–1.0)
Monocytes Relative: 6 % (ref 3–12)
Neutro Abs: 3 10*3/uL (ref 1.7–7.7)
Neutrophils Relative %: 68 % (ref 43–77)
PLATELETS: 105 10*3/uL — AB (ref 150–400)
RBC: 2.11 MIL/uL — AB (ref 4.22–5.81)
RDW: 15.3 % (ref 11.5–15.5)
WBC: 4.4 10*3/uL (ref 4.0–10.5)

## 2014-12-29 LAB — BASIC METABOLIC PANEL
ANION GAP: 4 — AB (ref 5–15)
BUN: 10 mg/dL (ref 6–23)
CHLORIDE: 113 mmol/L — AB (ref 96–112)
CO2: 29 mmol/L (ref 19–32)
Calcium: 8.1 mg/dL — ABNORMAL LOW (ref 8.4–10.5)
Creatinine, Ser: 1.11 mg/dL (ref 0.50–1.35)
GFR calc non Af Amer: 75 mL/min — ABNORMAL LOW (ref >90)
GFR, EST AFRICAN AMERICAN: 87 mL/min — AB (ref >90)
GLUCOSE: 104 mg/dL — AB (ref 70–99)
Potassium: 3.8 mmol/L (ref 3.5–5.1)
Sodium: 146 mmol/L — ABNORMAL HIGH (ref 135–145)

## 2014-12-29 LAB — GLUCOSE, CAPILLARY: Glucose-Capillary: 108 mg/dL — ABNORMAL HIGH (ref 70–99)

## 2014-12-29 LAB — PROTIME-INR
INR: 1.31 (ref 0.00–1.49)
Prothrombin Time: 16.4 seconds — ABNORMAL HIGH (ref 11.6–15.2)

## 2014-12-29 LAB — PREPARE RBC (CROSSMATCH)

## 2014-12-29 LAB — APTT: aPTT: 32 seconds (ref 24–37)

## 2014-12-29 LAB — CLOSTRIDIUM DIFFICILE BY PCR: Toxigenic C. Difficile by PCR: NEGATIVE

## 2014-12-29 MED ORDER — QUETIAPINE FUMARATE 50 MG PO TABS
50.0000 mg | ORAL_TABLET | Freq: Three times a day (TID) | ORAL | Status: DC
  Administered 2014-12-29 – 2014-12-31 (×6): 50 mg via ORAL
  Filled 2014-12-29 (×7): qty 1

## 2014-12-29 MED ORDER — VITAL HIGH PROTEIN PO LIQD: 1000.0000 mL | ORAL | Status: DC

## 2014-12-29 MED ORDER — KCL IN DEXTROSE-NACL 10-5-0.45 MEQ/L-%-% IV SOLN
INTRAVENOUS | Status: DC
  Administered 2014-12-29 – 2014-12-30 (×3): via INTRAVENOUS
  Filled 2014-12-29 (×7): qty 1000

## 2014-12-29 MED ORDER — LACTULOSE 10 GM/15ML PO SOLN
30.0000 g | Freq: Every day | ORAL | Status: DC
  Administered 2014-12-30 – 2014-12-31 (×2): 30 g
  Filled 2014-12-29 (×2): qty 45

## 2014-12-29 MED ORDER — VALPROIC ACID 250 MG/5ML PO SYRP
250.0000 mg | ORAL_SOLUTION | Freq: Two times a day (BID) | ORAL | Status: DC
  Administered 2014-12-29 – 2014-12-31 (×4): 250 mg
  Filled 2014-12-29 (×6): qty 5

## 2014-12-29 MED ORDER — VITAL AF 1.2 CAL PO LIQD
1000.0000 mL | ORAL | Status: DC
  Administered 2014-12-29 – 2014-12-30 (×2): 1000 mL
  Filled 2014-12-29 (×7): qty 1000

## 2014-12-29 MED ORDER — SODIUM CHLORIDE 0.9 % IV SOLN
Freq: Once | INTRAVENOUS | Status: AC
  Administered 2014-12-29: 19:00:00 via INTRAVENOUS

## 2014-12-29 MED ORDER — ZOLPIDEM TARTRATE 5 MG PO TABS
5.0000 mg | ORAL_TABLET | Freq: Every day | ORAL | Status: DC
  Administered 2014-12-29 – 2014-12-30 (×2): 5 mg via ORAL
  Filled 2014-12-29 (×2): qty 1
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
## 2014-12-29 NOTE — Progress Notes (Addendum)
[facility] PROGRESS NOTE  Antonio Tyler EAV:[id number] DOB: [birth year] DOA: 12/24/2014 PCP: No primary care provider on file.  Brief summary  Antonio Tyler is a 54 y.o. male with history of traumatic brain injury, subarachnoid hemorrhage, subdural hemorrhage, cerebral contusion, hypertension, alcohol abuse, tracheostomy, PEG tube, multiple rib fractures, T1 fracture, enterococcal bacteremia, IVC filter placement who was brought in from [facility]. Patient has indwelling Foley catheter which is chronic.  Staff removed the Foley catheter due to low urine output and tried to insert a 26 French Foley catheter for irrigation. Reportedly they were unsuccessful and saw large amount of gross blood coming out of meatus. This Foley was removed and replaced with 28 French catheter. He continues to have drainage of the blood and therefore he was brought to ER.   In the ER the catheter was initially deflated and was not able to be advanced and urology evaluated the patient. Urology removed the old catheter and after which he had profuse bleeding from the penis.  Later on 24 French catheter was placed.  After this procedure as the patient become hypotensive requiring fluids at which time critical care was consulted who recommended medicine admission and the patient was accepted for stepdown.    10/06/2014 traumatic brain injury admitted, with prolonged course requiring intubation, tracheostomy tube, PEG tube. 10/27/2014 patient was transferred to [facility] ICU for long-term acute management care. Patient continued to remain on vent, and T bar until March 28. Enterococcal bacteremia treated with vancomycin until March 31. Echocardiogram negative for vegetation. 12/20/2014 developed rash due to vancomycin infusion Elevated LFTs-Depakote was stopped, later on restarted on 12/22/2014. 12/23/2014 transition to regular floor on trach collar. Eliquis was started on 12/24/2014.  At his baseline dependent for his ADL.  Since  admission, he has had ongoing hematuria requiring CBI.  He continues to have agitation requiring 4-point restraints and precedex infusion.    Assessment/Plan  Hemorrhagic shock due to GU bleed, although may have had some septic shock also secondary to HAP. -  Transfuse to keep hgb > 7 -  tx for pneumonia as below -  BP now stable -  Appreciate PCCM assistance  Possible sepsis due to HAP/aspiration pneumonia s/p trach PTA, (pancytopenia, tachycardia, tachypnea) -  On broad spectrum abx initially -  sputum culture:  HAEMOPHILUS PARAINFLUENZAE (BETA LACTAMASE POSITIVE) -  abx changed to rocephin on 4/5  ARF due to GU bleed/hypotension, resolved.   Acute blood loss anemia -  s/p prbc transfusion on admission -  transfuse prbc x1 on 4/5 with no change in hemoglobin -  Transfuse 1 unit PRBC on 4/6 -  CT abd/pelvis pending to eval for retroperitoneal bleed -  Iron studies wnl  Hematuria/urethral bleed -  Urology following, on CBI. Still visible hematuria/occasional clots.  Agitation/h/o seizure, h/o TBI/alcohol abuse -  Continue four point restrain -  Continue abilify/phenobarbital/valproic acid -  Continue precedex as HR and BP tolerate -  Patient too agitated to undergo MRI -  Neuro signed of -  admitted from [facility] (Dr. Nelson Chimes [phone] reported that patient has been needing restrain 24/7 at their facility.)  -  Decrease lactulose 2/2 diarrhea  Diarrhea, likely due to lactulose -  F/u C. Diff -  Lactulose decreased  PEG tube:  patient pulled out his PEG tube -  Nutrition per Ng tube for now - Per IR Dr. Grace Isaac, previous PEG tube extended through the inferior edge of the liver and he recommended a new placement in a lower  location without liver involvement, likely a week from now to allow old tract to heal -  IR to replace PEG in a week from 4/5  H/o IVC filter, on chronic eliquis -  Eliquis on hold due to hematuria.  H/o HTN:  -  bp meds on hold initially due to  hypotension/arf, also has bradycardia initially.  -  bp elevated on 4/4, cr normalized, restarted lisinopril and added hydralazine prn  -  bp low with intermittent bradycardia overnight on 4/4-4/5, improved with lower dose on precedex, d/c lisinopril on 4/5  Hypernatremia likely due to inadequate free water and dehydration -  Change to hypotonic saline -  Increase free water through NG >> resume tube feeds with free water  Diet:  Tube feeds  Access:  CVC triple lumen left subclavian 4/2 IVF: off Proph:  SCD, IVC  Code Status: full Family Communication: patient alone Disposition Plan: pending PEG tube placement in one week   Consultants:  PCCM/neuro/urology  Procedures: picc placement  Antibiotics:  Vanc/cefepime from admission to 4/4.  Rocephin from 4/5   HPI/Subjective:  Patient unable to follow commands.  Agitated overnight and precedex resumed but stopped again this morning since symptoms appear to be chronic  Objective: Filed Vitals:   12/29/14 0300 12/29/14 0320 12/29/14 0400 12/29/14 0500  BP: 122/73 122/73 121/73 154/93  Pulse: 65 65 64 98  Temp:   98.5 F (36.9 C)   TempSrc:   Axillary   Resp: Height:      Weight:    89 kg (196 lb 3.4 oz)  SpO2: 100% 100% 100% 100%    Intake/Output Summary (Last 24 hours) at 12/29/14 0742 Last data filed at 12/29/14 0615  Gross per 24 hour  Intake 16649.11 ml  Output  16109 ml  Net  99.11 ml   Filed Weights   12/27/14 0359 12/28/14 0415 12/29/14 0500  Weight: 91.1 kg (200 lb 13.4 oz) 87.7 kg (193 lb 5.5 oz) 89 kg (196 lb 3.4 oz)    Exam:   General:  Obese male, No acute distress  HEENT:  NCAT, dry lips and mouth, NG in place, trach c/d/i  Cardiovascular:  RRR, nl S1, S2 no mrg, 2+ pulses, warm extremities  Respiratory:  CTAB, no increased WOB  Abdomen:   NABS, soft, NT/ND, peg tube site without erythema or induration or discharge  MSK:   Decreased bulk, no LEE, pulling at four point  restraints  Neuro:  Unable to follow commands  Data Reviewed: Basic Metabolic Panel:  Recent Labs Lab 12/25/14 0711 12/26/14 0300 12/27/14 0415 12/28/14 0215 12/29/14 0500  NA 138 141 146* 145 146*  K 4.0 3.9 3.6 3.6 3.8  CL 103 105 109 111 113*  CO2 GLUCOSE 92 94 124* 110* 104*  BUN <5* 29* CREATININE 1.80* 0.67 0.51 0.99 1.11  CALCIUM 7.3* 8.6 8.4 8.3* 8.1*  MG  --   --  1.7  --   --   PHOS  --   --  3.3  --   --    Liver Function Tests:  Recent Labs Lab 12/25/14 0711 12/27/14 0955  AST 31 37  ALT 33 35  ALKPHOS 77 84  BILITOT 1.0 0.8  PROT 5.7* 5.8*  ALBUMIN 2.4* 2.6*   No results for input(s): LIPASE, AMYLASE in the last 168 hours.  Recent Labs Lab 12/27/14 0954  AMMONIA 41*   CBC:  Recent Labs Lab 12/24/14 2157 12/25/14 0711 12/25/14 2343 12/26/14 0300 12/27/14 0415 12/28/14 0215 12/29/14 0500  WBC 8.7 5.1 3.2* 3.0* 2.5* 5.6 3.5*  NEUTROABS 6.2 3.6  --   --   --   --   --   HGB 10.1* 8.5* 7.8* 9.2* 8.7* 7.1* 7.1*  HCT 30.7* 25.7* 23.6* 27.8* 26.8* 21.1* 21.4*  MCV 93.0 91.1 92.5 90.8 91.5 90.9 91.8  PLT 133* 86* 72* 73* 75* 128* PENDING   Cardiac Enzymes:  Recent Labs Lab 12/24/14 2157  TROPONINI 0.03   BNP (last 3 results) No results for input(s): BNP in the last 8760 hours.  ProBNP (last 3 results) No results for input(s): PROBNP in the last 8760 hours.  CBG:  Recent Labs Lab 12/24/14 2136  GLUCAP 92    Recent Results (from the past 240 hour(s))  Blood culture (routine x 2)     Status: None (Preliminary result)   Collection Time: 12/24/14 11:35 PM  Result Value Ref Range Status   Specimen Description BLOOD LEFT HAND  Final   Special Requests BOTTLES DRAWN AEROBIC ONLY 8CC  Final   Culture   Final           BLOOD CULTURE RECEIVED NO GROWTH TO DATE CULTURE WILL BE HELD FOR 5 DAYS BEFORE ISSUING A FINAL NEGATIVE REPORT Performed at [facility]    Report Status PENDING  Incomplete   Blood culture (routine x 2)     Status: None (Preliminary result)   Collection Time: 12/25/14 12:34 AM  Result Value Ref Range Status   Specimen Description BLOOD LEFT HAND  Final   Special Requests BOTTLES DRAWN AEROBIC AND ANAEROBIC 5CC  Final   Culture   Final           BLOOD CULTURE RECEIVED NO GROWTH TO DATE CULTURE WILL BE HELD FOR 5 DAYS BEFORE ISSUING A FINAL NEGATIVE REPORT Performed at [facility]    Report Status PENDING  Incomplete  Culture, respiratory (NON-Expectorated)     Status: None   Collection Time: 12/25/14  3:33 AM  Result Value Ref Range Status   Specimen Description TRACHEAL ASPIRATE  Final   Special Requests NONE  Final   Gram Stain   Final    MODERATE WBC PRESENT,BOTH PMN AND MONONUCLEAR FEW SQUAMOUS EPITHELIAL CELLS PRESENT MODERATE GRAM NEGATIVE RODS RARE GRAM POSITIVE RODS Performed at [facility]    Culture   Final    MODERATE HAEMOPHILUS PARAINFLUENZAE Note: BETA LACTAMASE POSITIVE Performed at [facility]    Report Status 12/27/2014 FINAL  Final     Studies: Dg Chest Port 1 View  12/29/2014   CLINICAL DATA:  Shortness of Breath  EXAM: PORTABLE CHEST - 1 VIEW  COMPARISON:  December 27, 2014  FINDINGS: Tracheostomy catheter tip is 8.2 cm above the carina. Nasogastric tube tip and side port are below the diaphragm. Central catheter tip is in the superior vena cava. No pneumothorax. There is mild bibasilar atelectatic change. There is subtle patchy infiltrate in the right upper lobe. Lungs elsewhere clear. Heart is enlarged with pulmonary vascularity within normal limits. No adenopathy. No appreciable bone lesions.  IMPRESSION: Bibasilar atelectatic change. Persistent subtle patchy infiltrate right upper lobe. Tube and catheter positions as described without pneumothorax. Stable cardiac prominence.   Electronically Signed   By: Bretta Bang III M.D.   On: 12/29/2014 07:20   Dg Abd Portable 1v  12/27/2014   CLINICAL DATA:  NG  tube insertion.  EXAM: PORTABLE ABDOMEN - 1 VIEW  COMPARISON:  12/25/2014.  FINDINGS: NG tube noted with its tip in the upper portion stomach. IVC filter noted with its tip at the L1-L2 level. No bowel distention. No free air. Mild right base atelectasis. Cardiomegaly.  IMPRESSION: 1. NG tube noted with its tip in the upper portion stomach. No bowel distention.  2.  Mild right base atelectasis.  Cardiomegaly.   Electronically Signed   By: Maisie Fushomas  Register   On: 12/27/2014 16:51    Scheduled Meds: . antiseptic oral rinse  7 mL Mouth Rinse q12n4p  . ARIPiprazole  10 mg Per Tube Q12H  . baclofen  10 mg Oral Q12H  . cefTRIAXone (ROCEPHIN)  IV  1 g Intravenous Daily  . chlorhexidine  15 mL Mouth Rinse BID  . lactulose  30 g Per Tube BID  . morphine  30 mg Oral Q6H  . pantoprazole sodium  40 mg Per Tube Q24H  . PHENObarbital  32.5 mg Intravenous 3 times per day  . sertraline  100 mg Per Tube Daily  . thiamine  100 mg Per Tube Daily  . Valproic Acid  125 mg Per Tube BID  . zolpidem  5 mg Oral QHS   Continuous Infusions: . dextrose 5 % and 0.45 % NaCl with KCl 10 mEq/L    . sodium chloride irrigation      Principal Problem:   Hemorrhagic shock Active Problems:   Shock   TBI (traumatic brain injury)   SAH (subarachnoid hemorrhage)   Abnormal CT scan, head   Acute blood loss anemia   Bladder injury   S/P PICC central line placement, tracking in right IJ,    Hematuria   Seizure disorder   Tracheostomy in place   HCAP (healthcare-associated pneumonia)   Chronic pain syndrome   H/O ETOH abuse   Bedridden   h/o Enterococcal bacteremia   Psychomotor agitation   Dysphagia   Restlessness and agitation    Time spent: 30 min    Antonio Tyler  [facility] Pager [phone]. If 7PM-7AM, please contact night-coverage at [email or web address], password [email or web address] 12/29/2014, 7:42 AM  LOS: 4 days

## 2014-12-29 NOTE — Progress Notes (Signed)
CRITICAL VALUE ALERT  Critical value received:  hbg 6.4  Date of notification:  12/29/2014  Time of notification:  1840  Critical value read back:Yes.    Nurse who received alert:  Bronson CurbHannah Izaih Kataoka  MD notified (1st page): Dr. Vassie LollAlva  Time of first page:  1845  Verbals orders for 2 units PRBC emergently.

## 2014-12-29 NOTE — Progress Notes (Signed)
Speech Language Pathology Treatment: Antonio Tyler Speaking valve  Patient Details Name: Antonio Tyler MRN: [id number] DOB: [birth year] Today's Date: 12/29/2014 Time: [id number] SLP Time Calculation (min) (ACUTE ONLY): 20 min  Assessment / Plan / Recommendation Clinical Impression  Pt demonstrates excellent redirection of air to upper airway with PMSV in place for 15 minutes. No evidence of air trapping, vital signs stable. Pts speech in continues with responses to questions, though pt is minimally intelligible and responses are mostly unrelated to context. Voice is hoarse and respiration for speech is shallow, though pt is able to physically improve with effort. Cognition and sedation are the primary barriers to intelligibility, not airway patentcy or breath support. For now, pt to wear PMSV with full staff supervision, though question if pt could undergo a capping trial as secretions and sedation management improve.    HPI HPI: Pt is a 54 y/o male with PMH of TBI, SAH, subdural hemorrhage, cerebral contusion, HTN, alcohol abuse, tracheostomy status, PEG tube status, multiple rib fractures, T1 fracture, enterococcal bacteremia, and IVC filter placement. Pt brought in from [facility] with chronic profuse penile bleeding followng chronic foley change.    Pertinent Vitals Pain Assessment: Faces Faces Pain Scale: No hurt  SLP Plan  Continue with current plan of care;MBS    Recommendations Diet recommendations: NPO      Patient may use Passy-Tyler Speech Valve: Intermittently with supervision;During all therapies with supervision PMSV Supervision: Full       Follow up Recommendations: Skilled Nursing facility Plan: Continue with current plan of care;MBS    GO    Harlon DittyBonnie Megean Fabio, MA CCC-SLP [phone]  Antonio Tyler, Antonio Tyler Caroline 12/29/2014, 8:49 AM

## 2014-12-29 NOTE — Progress Notes (Signed)
Cuff deflated

## 2014-12-29 NOTE — Evaluation (Signed)
Clinical/Bedside Swallow Evaluation Patient Details  Name: Antonio Tyler MRN: [id number] Date of Birth: 1960-11-13  Today's Date: 12/29/2014 Time: SLP Start Time (ACUTE ONLY): 0820 SLP Stop Time (ACUTE ONLY): 0840 SLP Time Calculation (min) (ACUTE ONLY): 20 min  Past Medical History:  Past Medical History  Diagnosis Date  . Hypertension   . Seizures   . Anxiety   . Traumatic brain injury   . Alcohol abuse    Past Surgical History: No past surgical history on file. HPI:  Pt is a 54 y/o male with PMH of TBI, SAH, subdural hemorrhage, cerebral contusion, HTN, alcohol abuse, tracheostomy status, PEG tube status, multiple rib fractures, T1 fracture, enterococcal bacteremia, and IVC filter placement. Pt brought in from [facility] with chronic profuse penile bleeding followng chronic foley change.    Assessment / Plan / Recommendation Clinical Impression  Pt seen for initial assessment of swallow function. Upon SLP arrival RN reported she had just given a small dose of Ativan. Pt awake, but could not sustain arousal for more than 45 seconds without eyes becoming unfocused. (Pt seen yesterday, fully alert, restrained and restless but not agitated). Given tolerance of PMSV and responsiveness to contextual cues, PO trials attempted with moderate success. Pt able to orally manipulate bolus and initaite swallow though sometimes cues were needed to sustain attention to task. Pt shows good potential for swallowing PO and would recommend objective assessment during this admission to advance pt therapeutically if possible. Need for sedating medication will be a barrier to progress and regardless of success with MBS, pt may need ongoing long term alternate means for reliable nutrition.     Aspiration Risk  Moderate    Diet Recommendation NPO;Alternative means - long-term        Other  Recommendations Recommended Consults: MBS   Follow Up Recommendations  Skilled Nursing facility    Frequency and Duration  min 2x/week  2 weeks   Pertinent Vitals/Pain NA    SLP Swallow Goals     Swallow Study Prior Functional Status       General HPI: Pt is a 54 y/o male with PMH of TBI, SAH, subdural hemorrhage, cerebral contusion, HTN, alcohol abuse, tracheostomy status, PEG tube status, multiple rib fractures, T1 fracture, enterococcal bacteremia, and IVC filter placement. Pt brought in from [facility] with chronic profuse penile bleeding followng chronic foley change.  Type of Study: Bedside swallow evaluation Previous Swallow Assessment: FEES at [facility]. Per SLP there, pt aspirated Honey, study terminated, did not progress with dysphagia therapy.  Diet Prior to this Study: NPO (large NG) Temperature Spikes Noted: No Respiratory Status: Trach collar Trach Size and Type: #6;Cuff;Deflated;With PMSV in place History of Recent Intubation: No Behavior/Cognition: Lethargic Oral Cavity - Dentition: Adequate natural dentition Self-Feeding Abilities: Total assist Patient Positioning: Upright in bed Baseline Vocal Quality: Low vocal intensity;Hoarse Volitional Cough: Cognitively unable to elicit Volitional Swallow: Unable to elicit    Oral/Motor/Sensory Function Overall Oral Motor/Sensory Function: Other (comment) (does nto follow commands, no focal deficits. )   Ice Chips Ice chips: Impaired Presentation: Spoon Oral Phase Functional Implications: Oral holding   Thin Liquid Thin Liquid: Not tested    Nectar Thick Nectar Thick Liquid: Not tested   Honey Thick Honey Thick Liquid: Not tested   Puree Puree: Impaired Presentation: Spoon Oral Phase Functional Implications: Oral holding Pharyngeal Phase Impairments: Suspected delayed Swallow   Solid   GO    Solid: Not tested      Antonio Ditty, MA CCC-SLP [phone]  Antonio Tyler,  Antonio NearingBonnie Tyler 12/29/2014,9:47 AM

## 2014-12-29 NOTE — Progress Notes (Signed)
RN noted bright red urine output in pt's urinary catheter bag. CBI rated changed to fast. Collection bag emptied 2L of hematuria with numerous medium sized clots. Catheter manually flushed with medium sized clots returning. An additional 1200cc of bright red hematuria was collected. Pt's BP  Dropped to 95/44 (57), previously 153/76(102). Urology paged. Verbal orders to draw STAT CBC. AT 1830 BP 69/45. ELink notified. NS Bolus started. Pt placed in trendelenburg. Lab notified this RN of Critical hgb 6.4. Orders placed by Dr. Vassie LollAlva to give 2 units PRBC emergently. Pt alert to voice per baseline during this time. No anxiety or agitations. Four point restraints were removed from patient at 1830 when he became hypotensive.

## 2014-12-29 NOTE — Progress Notes (Signed)
NUTRITION FOLLOW UP  Intervention:   Initiate Vital AF 1.2 @ 20 ml/hr via NGT and increase by 10 ml every 4 hours to goal rate of 70 ml/hr.    Tube feeding regimen provides 2016 kcal (100% of needs), 126 grams of protein, and 1361 ml of H2O.   Nutrition Dx:   Inadequate oral intake related to inability to eat as evidenced by NPO status; ongoing  Goal:   Pt to meet >/= 90% of their estimated nutrition needs; umnet  Monitor:   TF advancement/ tolerance, weight trends, labs, I/O's  Assessment:   Pt with PMH of TBI, subarachnoid hemorrhage, subdural hemorrhage, cerebral contusion, hypertension, alcohol abuse, tracheostomy status, PEG tube status, multiple rib fractures, T1 fracture, enterococcal bacteremia, IVC filter placement. Pt from [facility], presents with hematuria.  RD received consult for TF initiation and management.  Case discussed with RN. Pt pulled out PEG tube on 12/25/14. Pt has been agitated, currently in 4 point restraints and on sedatives.  SLP has been working with pt and completed BSE- they recommend continue NPO status with nutrition via alternative means due to use of sedatives.  Per MD notes, plan to replace PEG on 01/04/15. Per IR notes, previous PEG tube extended through the inferior edge of the liver and recommendeds a new placement in a lower location without liver involvement to allow old tract to heal.  RN reports pt is receiving lactulose, which is giving pt diarrhea. She confirms NGT has been placed and pt is ready to start TF.  Plan is to d/c back on LTACH ([facility]) once medically stable.  Labs reviewed. Na: 146, Cl: 113, Calcium: 8.1, Glucose: 104. Mg, K, and Phos WDL.   Height: Ht Readings from Last 1 Encounters:  12/25/14 5\' 8"  (1.727 m)    Weight Status:   Wt Readings from Last 1 Encounters:  12/29/14 196 lb 3.4 oz (89 kg)   12/25/14 201 lb 11.5 oz (91.5 kg)       Re-estimated needs:  Kcal: 2000-2200 Protein: 115-125 grams Fluid: 2.0-2.2  L  Skin: tibial abrasion  Diet Order:  NPO   Intake/Output Summary (Last 24 hours) at 12/29/14 1003 Last data filed at 12/29/14 0930  Gross per 24 hour  Intake 13776.41 ml  Output  [id number] ml  Net -2373.59 ml    Last BM: 12/29/14   Labs:   Recent Labs Lab 12/27/14 0415 12/28/14 0215 12/29/14 0500  NA 146* 145 146*  K 3.6 3.6 3.8  CL 109 111 113*  CO2 30 29 29   BUN 13 10 10   CREATININE 0.51 0.99 1.11  CALCIUM 8.4 8.3* 8.1*  MG 1.7  --   --   PHOS 3.3  --   --   GLUCOSE 124* 110* 104*    CBG (last 3)  No results for input(s): GLUCAP in the last 72 hours.  Scheduled Meds: . antiseptic oral rinse  7 mL Mouth Rinse q12n4p  . ARIPiprazole  10 mg Per Tube Q12H  . baclofen  10 mg Oral Q12H  . cefTRIAXone (ROCEPHIN)  IV  1 g Intravenous Daily  . chlorhexidine  15 mL Mouth Rinse BID  . [START ON 12/30/2014] lactulose  30 g Per Tube Daily  . morphine  30 mg Oral Q6H  . pantoprazole sodium  40 mg Per Tube Q24H  . PHENObarbital  32.5 mg Intravenous 3 times per day  . sertraline  100 mg Per Tube Daily  . thiamine  100 mg Per Tube Daily  .  Valproic Acid  125 mg Per Tube BID  . zolpidem  5 mg Oral QHS    Continuous Infusions: . dextrose 5 % and 0.45 % NaCl with KCl 10 mEq/L 100 mL/hr at 12/29/14 0800  . sodium chloride irrigation      Chiniqua Kilcrease A. Mayford Knife, RD, LDN, CDE Pager: [phone] After hours Pager: [phone]
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
## 2014-12-30 ENCOUNTER — Inpatient Hospital Stay (HOSPITAL_COMMUNITY): Payer: Medicare Other

## 2014-12-30 ENCOUNTER — Other Ambulatory Visit: Payer: Self-pay | Admitting: Urology

## 2014-12-30 DIAGNOSIS — S3720XD Unspecified injury of bladder, subsequent encounter: Secondary | ICD-10-CM

## 2014-12-30 LAB — BASIC METABOLIC PANEL
ANION GAP: 6 (ref 5–15)
BUN: 7 mg/dL (ref 6–23)
CALCIUM: 7.5 mg/dL — AB (ref 8.4–10.5)
CO2: 28 mmol/L (ref 19–32)
CREATININE: 1.22 mg/dL (ref 0.50–1.35)
Chloride: 114 mmol/L — ABNORMAL HIGH (ref 96–112)
GFR calc Af Amer: 78 mL/min — ABNORMAL LOW (ref >90)
GFR calc non Af Amer: 67 mL/min — ABNORMAL LOW (ref >90)
GLUCOSE: 105 mg/dL — AB (ref 70–99)
Potassium: 3.6 mmol/L (ref 3.5–5.1)
Sodium: 148 mmol/L — ABNORMAL HIGH (ref 135–145)

## 2014-12-30 LAB — TYPE AND SCREEN
ABO/RH(D): A POS
Antibody Screen: NEGATIVE
UNIT DIVISION: 0
Unit division: 0
Unit division: 0

## 2014-12-30 LAB — GLUCOSE, CAPILLARY
GLUCOSE-CAPILLARY: 99 mg/dL (ref 70–99)
GLUCOSE-CAPILLARY: 110 mg/dL — AB (ref 70–99)
GLUCOSE-CAPILLARY: 71 mg/dL (ref 70–99)
Glucose-Capillary: 121 mg/dL — ABNORMAL HIGH (ref 70–99)
Glucose-Capillary: 84 mg/dL (ref 70–99)

## 2014-12-30 LAB — CBC
HCT: 23 % — ABNORMAL LOW (ref 39.0–52.0)
HEMOGLOBIN: 7.6 g/dL — AB (ref 13.0–17.0)
MCH: 29.7 pg (ref 26.0–34.0)
MCHC: 33 g/dL (ref 30.0–36.0)
MCV: 89.8 fL (ref 78.0–100.0)
Platelets: 105 10*3/uL — ABNORMAL LOW (ref 150–400)
RBC: 2.56 MIL/uL — ABNORMAL LOW (ref 4.22–5.81)
RDW: 15.9 % — ABNORMAL HIGH (ref 11.5–15.5)
WBC: 6 10*3/uL (ref 4.0–10.5)

## 2014-12-30 LAB — HEMOGLOBIN AND HEMATOCRIT, BLOOD
HEMATOCRIT: 21.8 % — AB (ref 39.0–52.0)
Hemoglobin: 7.2 g/dL — ABNORMAL LOW (ref 13.0–17.0)

## 2014-12-30 LAB — MRSA PCR SCREENING: MRSA by PCR: POSITIVE — AB

## 2014-12-30 MED ORDER — POTASSIUM CL IN DEXTROSE 5% 20 MEQ/L IV SOLN
20.0000 meq | INTRAVENOUS | Status: DC
  Administered 2014-12-30 – 2014-12-31 (×3): 20 meq via INTRAVENOUS
  Filled 2014-12-30 (×5): qty 1000

## 2014-12-30 NOTE — Progress Notes (Signed)
RT Note: Pt was gone for procedure. RT unavailable to AM trach check. RT will check back & continue to monitor.

## 2014-12-30 NOTE — Progress Notes (Signed)
Spoke to patient's brother, Antonio Tyler. He verbally consented to cystoscopy with clot evacuation, possible transurethral resection of prostate, possible transurethral resection of bladder tumor. Antonio LandauBrenda Staley, RN spoke with brother and confirmed this consent and verified that risks, benefits, and alternatives were discussed. His number is listed as the emergency number in EPIC. Antonio Tyler, his aunt [phone], was also contacted and explained the surgery to her as well. Ultimately, this procedure is a medical emergency due to his ongoing acute blood loss.
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
## 2014-12-30 NOTE — Progress Notes (Signed)
[facility], [facility].  Transported with cbi infusion, IVF infusion intact, trach collar in place as well.

## 2014-12-30 NOTE — Progress Notes (Signed)
MBSS complete. Full report located under chart review in imaging section.  
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
## 2014-12-31 ENCOUNTER — Inpatient Hospital Stay (HOSPITAL_COMMUNITY): Admission: RE | Admit: 2014-12-31 | Payer: Medicare Other | Source: Ambulatory Visit | Admitting: Urology

## 2014-12-31 LAB — CULTURE, BLOOD (ROUTINE X 2)
Culture: NO GROWTH
Culture: NO GROWTH

## 2014-12-31 LAB — ABO/RH: ABO/RH(D): A POS

## 2014-12-31 LAB — GLUCOSE, CAPILLARY
GLUCOSE-CAPILLARY: 99 mg/dL (ref 70–99)
GLUCOSE-CAPILLARY: 98 mg/dL (ref 70–99)
Glucose-Capillary: 111 mg/dL — ABNORMAL HIGH (ref 70–99)
Glucose-Capillary: 96 mg/dL (ref 70–99)

## 2014-12-31 LAB — PREPARE RBC (CROSSMATCH)

## 2014-12-31 MED ORDER — HYDROMORPHONE BOLUS VIA INFUSION
1.0000 mg | INTRAVENOUS | Status: DC | PRN
  Administered 2014-12-31 – 2015-01-01 (×5): 1 mg via INTRAVENOUS
  Filled 2014-12-31 (×6): qty 1

## 2014-12-31 MED ORDER — DIAZEPAM 5 MG/ML IJ SOLN
5.0000 mg | INTRAMUSCULAR | Status: DC
  Administered 2014-12-31 – 2015-01-01 (×6): 5 mg via INTRAVENOUS
  Filled 2014-12-31 (×5): qty 2

## 2014-12-31 MED ORDER — SODIUM CHLORIDE 0.9 % IV SOLN
Freq: Once | INTRAVENOUS | Status: DC
Start: 2014-12-31 — End: 2015-01-01

## 2014-12-31 MED ORDER — SODIUM CHLORIDE 0.9 % IV SOLN
2.0000 mg/h | INTRAVENOUS | Status: DC
  Administered 2014-12-31 – 2015-01-01 (×2): 0.5 mg/h via INTRAVENOUS
  Administered 2015-01-01 (×2): 4 mg/h via INTRAVENOUS
  Administered 2015-01-01: 2 mg/h via INTRAVENOUS
  Administered 2015-01-01: 0.5 mg/h via INTRAVENOUS
  Administered 2015-01-02 (×2): 5 mg/h via INTRAVENOUS
  Filled 2014-12-31 (×6): qty 2.5

## 2014-12-31 MED ORDER — DIAZEPAM 5 MG/ML IJ SOLN
10.0000 mg | INTRAMUSCULAR | Status: DC | PRN
  Administered 2015-01-01 (×3): 10 mg via INTRAVENOUS
  Filled 2014-12-31 (×4): qty 2

## 2014-12-31 MED ORDER — POTASSIUM CL IN DEXTROSE 5% 20 MEQ/L IV SOLN
20.0000 meq | INTRAVENOUS | Status: DC
  Filled 2014-12-31: qty 1000

## 2014-12-31 MED ORDER — CHLORHEXIDINE GLUCONATE CLOTH 2 % EX PADS
6.0000 | MEDICATED_PAD | Freq: Every day | CUTANEOUS | Status: DC
  Administered 2015-01-01 – 2015-01-03 (×3): 6 via TOPICAL

## 2014-12-31 MED ORDER — SODIUM CHLORIDE 0.9 % IV SOLN
INTRAVENOUS | Status: DC
  Administered 2014-12-31: 1000 mL via INTRAVENOUS

## 2014-12-31 MED ORDER — MUPIROCIN 2 % EX OINT
1.0000 "application " | TOPICAL_OINTMENT | Freq: Two times a day (BID) | CUTANEOUS | Status: AC
  Administered 2014-12-31 – 2015-01-05 (×10): 1 via NASAL
  Filled 2014-12-31 (×2): qty 22
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
## 2014-12-31 NOTE — Progress Notes (Signed)
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 filter noted with its tip at the L1-L2 level. No bowel distention. No free air. Mild right base atelectasis. Cardiomegaly.  IMPRESSION: 1. NG tube noted with its tip in the upper portion stomach. No bowel distention.  2.  Mild right base atelectasis.  Cardiomegaly.   Electronically Signed   By: Thomas  Register   On: 12/27/2014 16:51   Dg Abd Portable 1v  12/25/2014   CLINICAL DATA:  EvaMaisie Fusluate nasojejunal tube.  EXAM: PORTABLE ABDOMEN - 1 VIEW  COMPARISON:  CT of earlier today.  FINDINGS: Presumed nasogastric tube terminates at the body of the stomach with side port in the proximal stomach. IVC filter identified.  Non-obstructive bowel gas pattern. Pelvis excluded. Cardiomegaly. Right hemidiaphragm elevation.  IMPRESSION: Presumed nasogastric tube terminating at the body of the stomach.   Electronically Signed   By: Jeronimo GreavesKyle  Talbot M.D.   On: 12/25/2014 17:11   Dg Swallowing Func-speech Pathology  12/30/2014    Objective Swallowing Evaluation:   Leonia CoronaMaida Bermudez Bosch,  SLP Student  Supervised and reviewed by Harlon DittyBonnie DeBlois MA CCC-SLP  Patient Details  Name: Antonio Tyler MRN: [id number] Date of Birth: May 15, 1961  Today's Date: 12/30/2014 Time: SLP Start Time (ACUTE ONLY): 0930-SLP Stop Time (ACUTE ONLY): 1000 SLP Time Calculation (min) (ACUTE ONLY): 30 min  Past Medical History:  Past Medical History  Diagnosis Date  . Hypertension   . Seizures   . Anxiety   . Traumatic brain injury   . Alcohol abuse    Past Surgical History: No past surgical history on file. HPI:  HPI: Pt is a 54 y/o male with PMH of TBI, SAH, subdural hemorrhage,  cerebral contusion, HTN, alcohol abuse, tracheostomy status, PEG tube  status, multiple rib fractures, T1 fracture, enterococcal bacteremia, and  IVC filter placement. Pt brought in from [facility] with chronic profuse  penile bleeding followng chronic foley change.   No Data Recorded  Assessment / Plan / Recommendation CHL IP CLINICAL IMPRESSIONS 12/30/2014  Dysphagia Diagnosis Severe pharyngeal phase dysphagia;Mild oral phase  dysphagia  Clinical impression Pt presents with mild oral phase and severe pharyngeal  phase dysphagia characterized by reduced bolus manipulation, base of  tongue weakness, reduced epiglottic inversion, reduced hyolarungeal  elevation and airway protection. Severity of impairments suspect caused by  reduced sensation, NG tube, weakened pharyngeal muscles due to disuse  atrophy, and cognitive deficits in combination with sedating medication.  Pt silently aspirated across all liquid consistencies. Pt unable to expel  aspirate and follow cues to cough. No aspiration noted with purees. Noted  increased vallecular residue across consistencies. Pt able to reduce  vallecular residue when given cue to swallow (dry spoon), however, pt  response inconsitent. Recommend continued trials of pureed consistencies  with speech therapy only. Hopeful pt will be able to progress following NG  tube removal and better management of sedating medication.        CHL IP TREATMENT RECOMMENDATION 12/30/2014  Treatment Plan Recommendations Therapy as outlined in treatment plan below      CHL IP DIET RECOMMENDATION 12/30/2014  Diet Recommendations Dysphagia 1 (Puree)  Liquid Administration via (None)  Medication Administration Via alternative means  Compensations (None)  Postural Changes and/or Swallow Maneuvers (None)     CHL IP OTHER RECOMMENDATIONS 12/29/2014  Recommended Consults MBS  Oral Care Recommendations (None)  Other Recommendations (None)     CH[facility] IP FOLLOW UP RECOMMENDATIONS 12/30/2014  Follow up Recommendations [facility] facility     [facility] D[facility]  Speech Therapy Frequency (ACUTE ONLY) min 2x/week  Treatment Duration 2 weeks     Pertinent Vitals/Pain NA    SLP Swallow Goals No flowsheet data found.  No flowsheet data found.    No flowsheet data found.  CHL IP ORAL PHASE 12/30/2014  Lips (None)  Tongue (None)  Mucous membranes (None)  Nutritional status (None)  Other (None)  Oxygen therapy (None)  Oral Phase Impaired  Oral - Pudding Teaspoon (None)  Oral - Pudding Cup (None)  Oral - Honey Teaspoon Lingual/palatal residue;Other (Comment)  Oral - Honey Cup Lingual/palatal residue;Other (Comment)  Oral - Honey Syringe (None)  Oral - Nectar Teaspoon Lingual/palatal residue;Other (Comment)  Oral - Nectar Cup Lingual/palatal residue;Other (Comment)  Oral - Nectar Straw Lingual/palatal residue;Other (Comment)  Oral - Nectar Syringe (None)  Oral - Ice Chips (None)  Oral - Thin Teaspoon (None)  Oral - Thin Cup Lingual/palatal residue;Other (Comment)  Oral - Thin Straw (None)  Oral - Thin Syringe (None)  Oral - Puree Lingual/palatal residue;Other (Comment)  Oral - Mechanical Soft (None)  Oral - Regular (None)  Oral - Multi-consistency (None)  Oral - Pill (None)  Oral Phase - Comment (None)      CHL IP PHARYNGEAL PHASE 12/30/2014  Pharyngeal Phase Impaired  Pharyngeal - Pudding Teaspoon (None)  Penetration/Aspiration details (pudding teaspoon) (None)   Pharyngeal - Pudding Cup (None)  Penetration/Aspiration details (pudding cup) (None)  Pharyngeal - Honey Teaspoon Delayed swallow initiation;Premature spillage  to pyriform sinuses;Reduced epiglottic inversion;Pharyngeal residue -  valleculae;Reduced laryngeal elevation;Reduced airway/laryngeal  closure;Reduced tongue base retraction;Penetration/Aspiration during  swallow  Penetration/Aspiration details (honey teaspoon) Material enters airway,  passes BELOW cords and not ejected out despite cough attempt by patient  Pharyngeal - Honey Cup Delayed swallow initiation;Premature spillage to  pyriform sinuses;Reduced epiglottic inversion;Pharyngeal residue -  valleculae;Reduced laryngeal elevation;Reduced airway/laryngeal  closure;Reduced tongue base retraction;Penetration/Aspiration during  swallow  Penetration/Aspiration details (honey cup) Material enters airway, passes  BELOW cords and not ejected out despite cough attempt by patient  Pharyngeal - Honey Syringe (None)  Penetration/Aspiration details (honey syringe) (None)  Pharyngeal - Nectar Teaspoon (None)  Penetration/Aspiration details (nectar teaspoon) (None)  Pharyngeal - Nectar Cup Delayed swallow initiation;Premature spillage to  pyriform sinuses;Reduced epiglottic inversion;Pharyngeal residue -  valleculae;Reduced laryngeal elevation;Reduced airway/laryngeal  closure;Reduced tongue base retraction;Penetration/Aspiration during  swallow;Penetration/Aspiration before swallow  Penetration/Aspiration details (nectar cup) Material enters airway, passes  BELOW cords and not ejected out despite cough attempt by patient  Pharyngeal - Nectar Straw Delayed swallow initiation;Premature spillage to  pyriform sinuses;Reduced epiglottic inversion;Pharyngeal residue -  valleculae;Reduced laryngeal elevation;Reduced airway/laryngeal  closure;Reduced tongue base retraction;Penetration/Aspiration before  swallow  Penetration/Aspiration details (nectar straw) Material enters  airway,  passes BELOW cords and not ejected out despite cough attempt by patient  Pharyngeal - Nectar Syringe (None)  Penetration/Aspiration details (nectar syringe) (None)  Pharyngeal - Ice Chips (None)  Penetration/Aspiration details (ice chips) (None)  Pharyngeal - Thin Teaspoon (None)  Penetration/Aspiration details (thin teaspoon) (None)  Pharyngeal - Thin Cup Delayed swallow initiation;Premature spillage to  pyriform sinuses;Reduced epiglottic inversion;Reduced laryngeal  elevation;Reduced airway/laryngeal closure;Reduced tongue base  retraction;Penetration/Aspiration before swallow;Pharyngeal residue -  valleculae;Trace aspiration  Penetration/Aspiration details (thin cup) Material enters airway, passes  BELOW cords and not ejected out despite cough attempt by patient  Pharyngeal - Thin Straw (None)  Penetration/Aspiration details (thin straw) (None)  Pharyngeal - Thin Syringe (None)  Penetration/Aspiration details (thin syringe') (None)  Pharyngeal - Puree Delayed swallow initiation;Premature spillage to  pyriform sinuses;Reduced epiglottic inversion;Reduced laryngeal  elevation;Reduced airway/laryngeal closure;Reduced tongue base  retraction;Pharyngeal residue - valleculae  Penetration/Aspiration details (puree) (None)  Pharyngeal - Mechanical Soft (None)  Penetration/Aspiration details (mechanical soft) (None)  Pharyngeal - Regular (None)  Penetration/Aspiration details (regular) (None)  Pharyngeal - Multi-consistency (None)  Penetration/Aspiration details (multi-consistency) (None)  Pharyngeal - Pill (None)  Penetration/Aspiration details (pill) (None)  Pharyngeal Comment (None)     No flowsheet data found.  No flowsheet data found.         DeBlois, Riley Nearing 12/30/2014, 1:26 PM     CBC  Recent Labs Lab 12/24/14 2157 12/25/14 0711  12/28/14 0215 12/29/14 0500 12/29/14 1810 12/29/14 2217 12/30/14 0500 12/30/14 1730  WBC 8.7 5.1  < > 5.6 3.5* 4.4 7.1 6.0  --   HGB 10.1* 8.5*  < > 7.1*  7.1* 6.4* 8.3* 7.6* 7.2*  HCT 30.7* 25.7*  < > 21.1* 21.4* 19.3* 24.3* 23.0* 21.8*  PLT 133* 86*  < > 128* 85* 105* 111* 105*  --   MCV 93.0 91.1  < > 90.9 91.8 91.5 90.3 89.8  --   MCH 30.6 30.1  < > 30.6 30.5 30.3 30.9 29.7  --   MCHC 32.9 33.1  < > 33.6 33.2 33.2 34.2 33.0  --   RDW 14.7 15.0  < > 14.3 14.8 15.3 15.2 15.9*  --   LYMPHSABS 1.7 1.0  --   --   --  1.0  --   --   --   MONOABS 0.6 0.4  --   --   --  0.3  --   --   --   EOSABS 0.2 0.2  --   --   --  0.2  --   --   --   BASOSABS 0.0 0.0  --   --   --  0.0  --   --   --   < > = values in this interval not displayed.  Chemistries   Recent Labs Lab 12/25/14 0711 12/26/14 0300 12/27/14 0415 12/27/14 0955 12/28/14 0215 12/29/14 0500 12/30/14 0500  NA 138 141 146*  --  145 146* 148*  K 4.0 3.9 3.6  --  3.6 3.8 3.6  CL 103 105 109  --  111 113* 114*  CO2 28 28 30   --  29 29 28   GLUCOSE 92 94 124*  --  110* 104* 105*  BUN <5* 29* 13  --  10 10 7   CREATININE 1.80* 0.67 0.51  --  0.99 1.11 1.22  CALCIUM 7.3* 8.6 8.4  --  8.3* 8.1* 7.5*  MG  --   --  1.7  --   --   --   --   AST 31  --   --  37  --   --   --   ALT 33  --   --  35  --   --   --   ALKPHOS 77  --   --  84  --   --   --   BILITOT 1.0  --   --  0.8  --   --   --    ------------------------------------------------------------------------------------------------------------------ estimated creatinine clearance is 81.1 mL/min (by C-G formula based on Cr of 1.22). ------------------------------------------------------------------------------------------------------------------ No results for input(s): HGBA1C in the last 72 hours. ------------------------------------------------------------------------------------------------------------------ No results for input(s): CHOL, HDL, LDLCALC, TRIG, CHOLHDL, LDLDIRECT in the last 72 hours. ------------------------------------------------------------------------------------------------------------------ No results for  input(s): TSH, T4TOTAL, T3FREE, THYROIDAB in the last 72 hours.  Invalid input(s): FREET3 ------------------------------------------------------------------------------------------------------------------ No results for input(s): VITAMINB12, FOLATE, FERRITIN, TIBC, IRON, RETICCTPCT in the last 72 hours.  Coagulation profile  Recent Labs Lab 12/24/14 2157 12/25/14 0711 12/29/14 2217  INR 1.16 1.22 1.31    No results for input(s):  DDIMER in the last 72 hours.  Cardiac Enzymes  Recent Labs Lab 12/24/14 2157  TROPONINI 0.03   ------------------------------------------------------------------------------------------------------------------ Invalid input(s): POCBNP   Recent Labs  12/29/14 2359 12/30/14 0432 12/30/14 0832 12/30/14 2004 12/30/14 2314 12/31/14 0337  GLUCAP 121* 99 84 110* 71 99     RAI,RIPUDEEP M.D. [facility] 12/31/2014, 11:14 AM  Pager: [phone]   Between 7am to 7pm - call Pager - [phone]  After 7pm go to [email or web address] - password [email or web address]  Call night coverage person covering after 7pm

## 2014-12-31 NOTE — Progress Notes (Signed)
SLP Cancellation Note  Patient Details Name: Antonio Tyler MRN: [id number] DOB: December 29, 1960   Cancelled treatment:        See PT note.  Will hold SLP treatment today as well.  SLP will f/u on Monday.   [name], [name] 12/31/2014, 12:57 PM
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
## 2014-12-31 NOTE — Progress Notes (Signed)
Placed call to Ramiro's brother Remi DeterJohn Kerrigan, left message and also contacted his Aunt [name], left message. No family at bedside. This needs a full goals of care conversation with family present- he is from [facility] and previous [place]. Very poor prognosis for meaningful recovery. Noted [name] began discussion about goals of care-will need to have full family meeting ASAP. No outside records available for review.  Palliative team will continue to attempt communication with family re: goals of care.  Anderson MaltaElizabeth Golding, DO Palliative Medicine [phone]

## 2014-12-31 NOTE — Progress Notes (Signed)
PT Cancellation Note  Patient Details Name: Antonio Tyler MRN: [id number] DOB: September 10, 1961   Cancelled Treatment:    Reason Eval/Treat Not Completed: Medical issues which prohibited therapy (RN requests to hold eval until urology procedure, patient is somewhat quiet and RN doesn't want to  agitate bladder nor patient. )   Rada HayHill, Lashawnda Hancox Elizabeth 12/31/2014, 12:31 PM Blanchard KelchKaren Leola Fiore PT [phone]

## 2014-12-31 NOTE — Progress Notes (Signed)
PT trach and site WNL at this time. Remains on 28% ATC.

## 2014-12-31 NOTE — Progress Notes (Signed)
NUTRITION FOLLOW UP  Intervention:   Continue to increase Vital AF 1.2 via NGT by 10 ml every 4 hours to goal rate of 70 ml/hr.   Recommend free water: 200 ml H2O every 6 hours   Tube feeding regimen provides 2016 kcal (100% of needs), 126 grams of protein, and 1361 ml of H2O.  Total free water: 2161 ml   Nutrition Dx:   Inadequate oral intake related to inability to eat as evidenced by NPO status; ongoing  Goal:   Pt to meet >/= 90% of their estimated nutrition needs; umnet  Monitor:   TF advancement/ tolerance, weight trends, labs, I/O's  Assessment:   PMH of TBI, subarachnoid hemorrhage, subdural hemorrhage, cerebral contusion, hypertension, alcohol abuse, tracheostomy, PEG tube, multiple rib fractures, T1 fracture, enterococcal bacteremia, IVC filter placement. Pt from [facility], presents with hematuria.  Pt pulled out PEG tube on 12/25/14.  Plan to replace PEG on 01/04/15.  Pt on lactulose, which is giving pt diarrhea.  NGT in place (tip in upper stomach)  Pt receiving Vital AF 1.2 @ 50 ml/hr with goal rate of 70 ml/hr.  Tube feeding currently providing 1440 kcal, 90 grams protein, and 973 ml H2O.  Spoke with RN, she reports [facility] that the tube feeding was started @ 10 ml/hr and advanced by 10 ml every 4 hours. Plan is to have TF at goal this evening.  Pt will be NPO after MN for cystoscopy with clot evacuation, possible transurethral resection of prostate, possible transurethral resection of bladder tumor  Sodium elevated.   Height: Ht Readings from Last 1 Encounters:  12/25/14 5\' 8"  (1.727 m)    Weight Status:   Wt Readings from Last 1 Encounters:  12/31/14 214 lb 15.2 oz (97.5 kg)   12/25/14 201 lb 11.5 oz (91.5 kg)       Re-estimated needs:  Kcal: 2000-2200 Protein: 115-125 grams Fluid: 2.0-2.2 L  Skin: tibial abrasion  Diet Order:  NPO   Intake/Output Summary (Last 24 hours) at 12/31/14 1237 Last data filed at 12/31/14  1000  Gross per 24 hour  Intake   5930 ml  Output   1850 ml  Net   4080 ml    Last BM: 4/7   Labs:   Recent Labs Lab 12/27/14 0415 12/28/14 0215 12/29/14 0500 12/30/14 0500  NA 146* 145 146* 148*  K 3.6 3.6 3.8 3.6  CL 109 111 113* 114*  CO2 30 29 29 28   BUN 13 10 10 7   CREATININE 0.51 0.99 1.11 1.22  CALCIUM 8.4 8.3* 8.1* 7.5*  MG 1.7  --   --   --   PHOS 3.3  --   --   --   GLUCOSE 124* 110* 104* 105*    CBG (last 3)   Recent Labs  12/31/14 0337 12/31/14 0747 12/31/14 1151  GLUCAP 99 98 111*    Scheduled Meds: . sodium chloride   Intravenous Once  . antiseptic oral rinse  7 mL Mouth Rinse q12n4p  . baclofen  10 mg Oral Q12H  . cefTRIAXone (ROCEPHIN)  IV  1 g Intravenous Daily  . chlorhexidine  15 mL Mouth Rinse BID  . lactulose  30 g Per Tube Daily  . morphine  30 mg Oral Q6H  . pantoprazole sodium  40 mg Per Tube Q24H  . PHENObarbital  32.5 mg Intravenous 3 times per day  . QUEtiapine  50 mg Oral TID  . sertraline  100 mg Per Tube Daily  . thiamine  100 mg Per Tube Daily  . Valproic Acid  250 mg Per Tube BID  . zolpidem  5 mg Oral QHS    Continuous Infusions: . dextrose 5 % with KCl 20 mEq / L 20 mEq (12/31/14 1112)  . feeding supplement (VITAL AF 1.2 CAL) 1,000 mL (12/31/14 1123)  . sodium chloride irrigation      Kendell Bane RD, LDN, CNSC [phone] Pager [phone] After Hours Pager

## 2015-01-01 ENCOUNTER — Encounter (HOSPITAL_COMMUNITY): Admission: RE | Payer: Self-pay | Source: Ambulatory Visit

## 2015-01-01 DIAGNOSIS — R450 Nervousness: Secondary | ICD-10-CM

## 2015-01-01 SURGERY — TURP (TRANSURETHRAL RESECTION OF PROSTATE)
Anesthesia: Choice

## 2015-01-01 MED ORDER — MIDAZOLAM HCL 5 MG/ML IJ SOLN
1.0000 mg/h | INTRAMUSCULAR | Status: DC
  Administered 2015-01-01: 2.5 mg/h via INTRAVENOUS
  Administered 2015-01-01: 4.5 mg/h via INTRAVENOUS
  Administered 2015-01-01: 2 mg/h via INTRAVENOUS
  Administered 2015-01-01: 4.5 mg/h via INTRAVENOUS
  Filled 2015-01-01 (×2): qty 10

## 2015-01-01 MED ORDER — HYDROMORPHONE BOLUS VIA INFUSION
2.0000 mg | INTRAVENOUS | Status: DC | PRN
  Filled 2015-01-01: qty 2

## 2015-01-01 MED ORDER — SODIUM CHLORIDE 0.9 % IR SOLN
3000.0000 mL | Status: DC
  Administered 2015-01-03: 3000 mL

## 2015-01-01 NOTE — Progress Notes (Signed)
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 noted with its tip at the L1-L2 level. No bowel distention. No free air. Mild right base atelectasis. Cardiomegaly.  IMPRESSION: 1. NG tube noted with its tip in the upper portion stomach. No bowel distention.  2.  Mild right base atelectasis.  Cardiomegaly.   Electronically Signed   By: Maisie Fus  Register   On: 12/27/2014 16:51   Dg Abd Portable 1v  12/25/2014   CLINICAL DATA:  Evaluate nasojejunal tube.  EXAM: PORTABLE ABDOMEN - 1 VIEW  COMPARISON:  CT  of earlier today.  FINDINGS: Presumed nasogastric tube terminates at the body of the stomach with side port in the proximal stomach. IVC filter identified.  Non-obstructive bowel gas pattern. Pelvis excluded. Cardiomegaly. Right hemidiaphragm elevation.  IMPRESSION: Presumed nasogastric tube terminating at the body of the stomach.   Electronically Signed   By: Jeronimo Greaves M.D.   On: 12/25/2014 17:11   Dg Swallowing Func-speech Pathology  12/30/2014    Objective Swallowing Evaluation:   Leonia Corona, SLP Student  Supervised and reviewed by Harlon Ditty MA CCC-SLP  Patient Details  Name: Dmarco Baldus MRN: [id number] Date of Birth: [birth year]  Today's Date: 12/30/2014 Time: SLP Start Time (ACUTE ONLY): 0930-SLP Stop Time (ACUTE ONLY): 1000 SLP Time Calculation (min) (ACUTE ONLY): 30 min  Past Medical History:  Past Medical History  Diagnosis Date  . Hypertension   . Seizures   . Anxiety   . Traumatic brain injury   . Alcohol abuse    Past Surgical History: No past surgical history on file. HPI:  HPI: Pt is a 54 y/o male with PMH of TBI, SAH, subdural hemorrhage,  cerebral contusion, HTN, alcohol abuse, tracheostomy status, PEG tube  status, multiple rib fractures, T1 fracture, enterococcal bacteremia, and  IVC filter placement. Pt brought in from [facility] with chronic profuse  penile bleeding followng chronic foley change.   No Data Recorded  Assessment / Plan / Recommendation CHL IP CLINICAL IMPRESSIONS 12/30/2014  Dysphagia Diagnosis Severe pharyngeal phase dysphagia;Mild oral phase  dysphagia  Clinical impression Pt presents with mild oral phase and severe pharyngeal  phase dysphagia characterized by reduced bolus manipulation, base of  tongue weakness, reduced epiglottic inversion, reduced hyolarungeal  elevation and airway protection. Severity of impairments suspect caused by  reduced sensation, NG tube, weakened pharyngeal muscles due to disuse  atrophy, and cognitive deficits in combination with sedating medication.  Pt silently aspirated across all liquid consistencies. Pt unable to expel  aspirate and follow cues to cough. No aspiration noted with purees. Noted  increased vallecular residue across consistencies. Pt able to reduce  vallecular residue when given cue to swallow (dry spoon), however, pt  response inconsitent. Recommend continued trials of pureed consistencies  with speech therapy only. Hopeful pt will be able to progress following NG  tube removal and better management of sedating medication.       CHL IP TREATMENT RECOMMENDATION 12/30/2014   Treatment Plan Recommendations Therapy as outlined in treatment plan below      [facility] IP DIET RECOMMENDATION 12/30/2014  Diet Recommendations Dysphagia 1 (Puree)  Liquid Administration via (None)  Medication Administration Via alternative means  Compensations (None)  Postural Changes and/or Swallow Maneuvers (None)     CHL IP OTHER RECOMMENDATIONS 12/29/2014  Recommended Consults MBS  Oral Care Recommendations (None)  Other Recommendations (None)     CHL IP FOLLOW UP RECOMMENDATIONS 12/30/2014  Follow up Recommendations Skilled Nursing facility     [facility] [place] IP FREQUENCY AND DURATION 12/30/2014  Speech Therapy Frequency (ACUTE ONLY) min 2x/week  Treatment Duration 2 weeks     Pertinent Vitals/Pain NA    SLP Swallow Goals No flowsheet data found.  No flowsheet data found.    No flowsheet data found.   CHL IP ORAL PHASE 12/30/2014  Lips (None)  Tongue (None)  Mucous membranes (None)  Nutritional status (None)  Other (None)  Oxygen therapy (None)  Oral Phase Impaired  Oral - Pudding Teaspoon (None)  Oral - Pudding Cup (None)  Oral - Honey Teaspoon  Lingual/palatal residue;Other (Comment)  Oral - Honey Cup Lingual/palatal residue;Other (Comment)  Oral - Honey Syringe (None)  Oral - Nectar Teaspoon Lingual/palatal residue;Other (Comment)  Oral - Nectar Cup Lingual/palatal residue;Other (Comment)  Oral - Nectar Straw Lingual/palatal residue;Other (Comment)  Oral - Nectar Syringe (None)  Oral - Ice Chips (None)  Oral - Thin Teaspoon (None)  Oral - Thin Cup Lingual/palatal residue;Other (Comment)  Oral - Thin Straw (None)  Oral - Thin Syringe (None)  Oral - Puree Lingual/palatal residue;Other (Comment)  Oral - Mechanical Soft (None)  Oral - Regular (None)  Oral - Multi-consistency (None)  Oral - Pill (None)  Oral Phase - Comment (None)      CHL IP PHARYNGEAL PHASE 12/30/2014  Pharyngeal Phase Impaired  Pharyngeal - Pudding Teaspoon (None)  Penetration/Aspiration details (pudding teaspoon) (None)  Pharyngeal - Pudding Cup (None)   Penetration/Aspiration details (pudding cup) (None)  Pharyngeal - Honey Teaspoon Delayed swallow initiation;Premature spillage  to pyriform sinuses;Reduced epiglottic inversion;Pharyngeal residue -  valleculae;Reduced laryngeal elevation;Reduced airway/laryngeal  closure;Reduced tongue base retraction;Penetration/Aspiration during  swallow  Penetration/Aspiration details (honey teaspoon) Material enters airway,  passes BELOW cords and not ejected out despite cough attempt by patient  Pharyngeal - Honey Cup Delayed swallow initiation;Premature spillage to  pyriform sinuses;Reduced epiglottic inversion;Pharyngeal residue -  valleculae;Reduced laryngeal elevation;Reduced airway/laryngeal  closure;Reduced tongue base retraction;Penetration/Aspiration during  swallow  Penetration/Aspiration details (honey cup) Material enters airway, passes  BELOW cords and not ejected out despite cough attempt by patient  Pharyngeal - Honey Syringe (None)  Penetration/Aspiration details (honey syringe) (None)  Pharyngeal - Nectar Teaspoon (None)  Penetration/Aspiration details (nectar teaspoon) (None)  Pharyngeal - Nectar Cup Delayed swallow initiation;Premature spillage to  pyriform sinuses;Reduced epiglottic inversion;Pharyngeal residue -  valleculae;Reduced laryngeal elevation;Reduced airway/laryngeal  closure;Reduced tongue base retraction;Penetration/Aspiration during  swallow;Penetration/Aspiration before swallow  Penetration/Aspiration details (nectar cup) Material enters airway, passes  BELOW cords and not ejected out despite cough attempt by patient  Pharyngeal - Nectar Straw Delayed swallow initiation;Premature spillage to  pyriform sinuses;Reduced epiglottic inversion;Pharyngeal residue -  valleculae;Reduced laryngeal elevation;Reduced airway/laryngeal  closure;Reduced tongue base retraction;Penetration/Aspiration before  swallow  Penetration/Aspiration details (nectar straw) Material enters airway,  passes BELOW cords and  not ejected out despite cough attempt by patient  Pharyngeal - Nectar Syringe (None)  Penetration/Aspiration details (nectar syringe) (None)  Pharyngeal - Ice Chips (None)  Penetration/Aspiration details (ice chips) (None)  Pharyngeal - Thin Teaspoon (None)  Penetration/Aspiration details (thin teaspoon) (None)  Pharyngeal - Thin Cup Delayed swallow initiation;Premature spillage to  pyriform sinuses;Reduced epiglottic inversion;Reduced laryngeal  elevation;Reduced airway/laryngeal closure;Reduced tongue base  retraction;Penetration/Aspiration before swallow;Pharyngeal residue -  valleculae;Trace aspiration  Penetration/Aspiration details (thin cup) Material enters airway, passes  BELOW cords and not ejected out despite cough attempt by patient  Pharyngeal - Thin Straw (None)  Penetration/Aspiration details (thin straw) (None)  Pharyngeal - Thin Syringe (None)  Penetration/Aspiration details (thin syringe') (None)  Pharyngeal - Puree Delayed swallow initiation;Premature spillage to  pyriform sinuses;Reduced epiglottic inversion;Reduced laryngeal  elevation;Reduced airway/laryngeal closure;Reduced tongue base  retraction;Pharyngeal residue - valleculae  Penetration/Aspiration details (puree) (None)  Pharyngeal - Mechanical Soft (None)  Penetration/Aspiration details (mechanical soft) (None)  Pharyngeal - Regular (None)  Penetration/Aspiration details (regular) (None)  Pharyngeal - Multi-consistency (None)  Penetration/Aspiration details (multi-consistency) (None)  Pharyngeal - Pill (None)  Penetration/Aspiration details (pill) (None)  Pharyngeal Comment (None)     No flowsheet data found.  No flowsheet data found.         DeBlois, Riley Nearing 12/30/2014, 1:26 PM  CBC  Recent Labs Lab 12/28/14 0215 12/29/14 0500 12/29/14 1810 12/29/14 2217 12/30/14 0500 12/30/14 1730  WBC 5.6 3.5* 4.4 7.1 6.0  --   HGB 7.1* 7.1* 6.4* 8.3* 7.6* 7.2*  HCT 21.1* 21.4* 19.3* 24.3* 23.0* 21.8*  PLT 128* 85* 105* 111*  105*  --   MCV 90.9 91.8 91.5 90.3 89.8  --   MCH 30.6 30.5 30.3 30.9 29.7  --   MCHC 33.6 33.2 33.2 34.2 33.0  --   RDW 14.3 14.8 15.3 15.2 15.9*  --   LYMPHSABS  --   --  1.0  --   --   --   MONOABS  --   --  0.3  --   --   --   EOSABS  --   --  0.2  --   --   --   BASOSABS  --   --  0.0  --   --   --     Chemistries   Recent Labs Lab 12/26/14 0300 12/27/14 0415 12/27/14 0955 12/28/14 0215 12/29/14 0500 12/30/14 0500  NA 141 146*  --  145 146* 148*  K 3.9 3.6  --  3.6 3.8 3.6  CL 105 109  --  111 113* 114*  CO2 28 30  --  29 29 28   GLUCOSE 94 124*  --  110* 104* 105*  BUN 29* 13  --  10 10 7   CREATININE 0.67 0.51  --  0.99 1.11 1.22  CALCIUM 8.6 8.4  --  8.3* 8.1* 7.5*  MG  --  1.7  --   --   --   --   AST  --   --  37  --   --   --   ALT  --   --  35  --   --   --   ALKPHOS  --   --  84  --   --   --   BILITOT  --   --  0.8  --   --   --    ------------------------------------------------------------------------------------------------------------------ estimated creatinine clearance is 80.7 mL/min (by C-G formula based on Cr of 1.22). ------------------------------------------------------------------------------------------------------------------ No results for input(s): HGBA1C in the last 72 hours. ------------------------------------------------------------------------------------------------------------------ No results for input(s): CHOL, HDL, LDLCALC, TRIG, CHOLHDL, LDLDIRECT in the last 72 hours. ------------------------------------------------------------------------------------------------------------------ No results for input(s): TSH, T4TOTAL, T3FREE, THYROIDAB in the last 72 hours.  Invalid input(s): FREET3 ------------------------------------------------------------------------------------------------------------------ No results for input(s): VITAMINB12, FOLATE, FERRITIN, TIBC, IRON, RETICCTPCT in the last 72 hours.  Coagulation profile  Recent  Labs Lab 12/29/14 2217  INR 1.31    No results for input(s): DDIMER in the last 72 hours.  Cardiac Enzymes No results for input(s): CKMB, TROPONINI, MYOGLOBIN in the last 168 hours.  Invalid input(s): CK ------------------------------------------------------------------------------------------------------------------ Invalid input(s): POCBNP   Recent Labs  12/30/14 2004 12/30/14 2314 12/31/14 0337 12/31/14 0747 12/31/14 1151 12/31/14 1648  GLUCAP 110* 71 99 98 111* 96     RAI,RIPUDEEP M.D. [facility] 01/01/2015, 9:06 AM  Pager: [phone]   Between 7am to 7pm - call Pager - [phone]  After 7pm go to [email or web address] - password [email or web address]  Call night coverage person covering after 7pm

## 2015-01-01 NOTE — Progress Notes (Addendum)
Daily Progress Note   Patient Name: Antonio Tyler  DOB: Mar 03, 1961          Age: 54 y.o.             Attending Physician: Cathren Harshipudeep K Rai, MD MRN#: [id number]                                            Primary Care Physician: No primary care provider on file. Admit Date: 12/24/2014  Date: 01/01/2015  Reason for Consultation/Follow-up: Terminal care  Subjective: Extreme agitation. Patient unable to provide information.  Interval Events: Transitioned to comfort care 4/8 LOS- 7 days  Current Medications: Scheduled Meds:  . antiseptic oral rinse  7 mL Mouth Rinse q12n4p  . chlorhexidine  15 mL Mouth Rinse BID  . Chlorhexidine Gluconate Cloth  6 each Topical Q0600  . mupirocin ointment  1 application Nasal BID    Continuous Infusions: . sodium chloride 1,000 mL (12/31/14 2018)  . HYDROmorphone 0.5 mg/hr (01/01/15 1200)  . midazolam (VERSED) infusion    . sodium chloride irrigation      PRN Meds: haloperidol lactate, HYDROmorphone, ipratropium-albuterol  Stool Softner: yes  Palliative Performance Scale: 20 %   Vital Signs: BP 114/77 mmHg  Pulse 133  Temp(Src) 98.5 F (36.9 C) (Axillary)  Resp 20  Ht 5\' 8"  (1.727 m)  Wt 96.5 kg (212 lb 11.9 oz)  BMI 32.36 kg/m2  SpO2 98% SpO2: SpO2: 98 % O2 Device: O2 Device: Tracheostomy Collar O2 Flow Rate: O2 Flow Rate (L/min): 5 L/min  Intake/output summary:  Intake/Output Summary (Last 24 hours) at 01/01/15 1400 Last data filed at 01/01/15 1300  Gross per 24 hour  Intake 2763.38 ml  Output   3600 ml  Net -836.62 ml    LBM:    Baseline Weight: Weight: 91.5 kg (201 lb 11.5 oz) Most recent weight: Weight: 96.5 kg (212 lb 11.9 oz)  Physical Exam:             General Appearance:combative             Heart: normal rate, regular rhythm, normal S1, S2, no murmurs, rubs, clicks or gallops, not examined             Respiratory: mild retractions             Abdomen:              Extremities:1+ edema             Neurologic: 5/5  strength bilateral upper and lower extremities              Additional Data Reviewed: Recent Labs     12/29/14  2217  12/30/14  0500  12/30/14  1730  WBC  7.1  6.0   --   HGB  8.3*  7.6*  7.2*  PLT  111*  105*   --   NA   --   148*   --   BUN   --   7   --   CREATININE   --   1.22   --      Problem List:  Patient Active Problem List   Diagnosis Date Noted  . Sepsis 12/29/2014  . Restlessness and agitation   . Psychomotor agitation   . Dysphagia   . Hemorrhagic shock 12/25/2014  . Shock 12/25/2014  .  TBI (traumatic brain injury) 12/25/2014  . SAH (subarachnoid hemorrhage) 12/25/2014  . Abnormal CT scan, head 12/25/2014  . Acute blood loss anemia 12/25/2014  . Bladder injury 12/25/2014  . S/P PICC central line placement, tracking in right IJ,  12/25/2014  . Hematuria 12/25/2014  . Seizure disorder 12/25/2014  . Tracheostomy in place 12/25/2014  . HCAP (healthcare-associated pneumonia) 12/25/2014  . Chronic pain syndrome 12/25/2014  . H/O ETOH abuse 12/25/2014  . Bedridden 12/25/2014  . h/o Enterococcal bacteremia 12/25/2014     Palliative Care Assessment & Plan    1. Code Status: DNR  2. Goals of Care:  Full comfort- palliative sedation if needed- family consented  Desire for further Chaplaincy support:yes  3. Symptom Management: VERY POOR CONTROL- requiring 4 point restraints-very agitated  Titrate Dilaudid infusion for his comfort  Initiate a versed infusion for severe refractory agitation  MAINTAIN continuous bladder irrigation for comfort  4. Palliative Prophylaxis:  Stool Softner: yes  5. Prognosis: < 2 weeks  5. Disposition Recommendations: Hospice facility  Anticipate Hospital Death: possible  Impression and plan was discussed with Brother and Aunt on 4/8-will have RN page when they arrive  Thank you for allowing the Palliative Medicine Team to assist in the care of this patient.   Time In: 1:45 Time Out: 2 Total Time 15 minutes  Prolonged Time Billed  no     Greater than 50%  of this time was spent counseling and coordinating care related to the above assessment and plan.   Edsel Petrin, DO  01/01/2015, 2:00 PM  Please contact Palliative Medicine Team phone at [phone] for questions and concerns.

## 2015-01-01 NOTE — Progress Notes (Addendum)
Patient ID: Antonio Tyler, male   DOB: August 16, 1961, 54 y.o.   MRN: [id number]   I was called to see Antonio KidaJack because of continued issues with clot retention.  He has been on CBI and has required frequent hand irrigation which eventually was unsuccessful.  I used a [place]y syringe and saline to irrigate the bladder and was able to eventually evacuate 2-32900ml of clots and about 1300ml of bloody urine.   The final clot was removed by emptying the balloon to get the port near the bladder neck.  With that maneuver I was able to get him clear.  The foley was advanced and the balloon re-inflated with 25ml.   He was placed back on CBI with clear efflux from the foley.  Imp: Clot retention.  Plan: Continue CBI through the night and if the urine remains clear, stop it in the morning.   30 minutes was spent at the bedside irrigating the foley.

## 2015-01-01 NOTE — Progress Notes (Signed)
Patient ID: Antonio Tyler, male   DOB: 09-13-1961, 54 y.o.   MRN: [id number]   I was asked by his nurse to reorder the CBI.   The decision was made to place him on comfort care only and the cystoscopy was cancelled.  He continues to have gross hematuria and clotted his foley and became more agitated.   His nurse was able to irrigate the foley which improved the agitation.   He is now resting quietly.   The foley is draining pink urine on CBI.    Imp: Clot retention.  Rec: Continue CBI with hand irrigation prn to avoid recurrent retention.

## 2015-01-01 NOTE — Plan of Care (Addendum)
Problem: Phase I Progression Outcomes Goal: Non-pain symptoms managed Outcome: Progressing Versed gtt. Started to help severe agitation.    Pt. extremely restless and attempting to remove essential equipment (foley and CBI ) etc...  Dilaudid gtt increased as ordered, as needed. Goal: Voiding-avoid urinary catheter unless indicated Outcome: Not Applicable Date Met:  75/91/63 Pt's foley noted to have gross hematuria and clots this am.  CBI restarted for comfort care. Goal: Psychosocial & spiritual needs assessed Outcome: Progressing Attempted many times to provide supportive verbal care to the pt without any effect.  Pt continuously moving and restless, flailing all extremities.  Despite ordered medication.  Order received for restraints and new orders received to increase medications for comfort.

## 2015-01-02 MED ORDER — SODIUM CHLORIDE 0.9 % IV SOLN
2.0000 mg/h | INTRAVENOUS | Status: DC
  Administered 2015-01-02: 4 mg/h via INTRAVENOUS
  Administered 2015-01-03 – 2015-01-04 (×3): 11 mg/h via INTRAVENOUS
  Administered 2015-01-04 (×2): 12 mg/h via INTRAVENOUS
  Administered 2015-01-05 – 2015-01-06 (×5): 15 mg/h via INTRAVENOUS
  Administered 2015-01-06 (×2): 2 mg/h via INTRAVENOUS
  Administered 2015-01-06 – 2015-01-08 (×7): 15 mg/h via INTRAVENOUS
  Administered 2015-01-08: 16 mg/h via INTRAVENOUS
  Filled 2015-01-02 (×19): qty 10

## 2015-01-02 MED ORDER — MIDAZOLAM HCL 2 MG/2ML IJ SOLN
3.0000 mg | Freq: Once | INTRAMUSCULAR | Status: AC
  Administered 2015-01-02: 3 mg via INTRAVENOUS

## 2015-01-02 MED ORDER — MIDAZOLAM HCL 5 MG/ML IJ SOLN
1.0000 mg/h | INTRAMUSCULAR | Status: DC
  Administered 2015-01-02 (×2): 5 mg/h via INTRAVENOUS
  Administered 2015-01-03: 9 mg/h via INTRAVENOUS
  Administered 2015-01-03: 7 mg/h via INTRAVENOUS
  Administered 2015-01-03: 9 mg/h via INTRAVENOUS
  Administered 2015-01-03: 5 mg/h via INTRAVENOUS
  Administered 2015-01-03: 9 mg/h via INTRAVENOUS
  Administered 2015-01-04 – 2015-01-05 (×9): 10 mg/h via INTRAVENOUS
  Administered 2015-01-06: 15 mg/h via INTRAVENOUS
  Administered 2015-01-06: 2 mg/h via INTRAVENOUS
  Administered 2015-01-06 (×2): 5 mg/h via INTRAVENOUS
  Administered 2015-01-06: 20 mg/h via INTRAVENOUS
  Administered 2015-01-06 (×3): 15 mg/h via INTRAVENOUS
  Administered 2015-01-06: 10 mg/h via INTRAVENOUS
  Administered 2015-01-06: 15 mg/h via INTRAVENOUS
  Administered 2015-01-07 (×6): 20 mg/h via INTRAVENOUS
  Filled 2015-01-02 (×28): qty 10

## 2015-01-02 MED ORDER — MIDAZOLAM HCL 2 MG/2ML IJ SOLN
1.0000 mg | INTRAMUSCULAR | Status: DC | PRN
  Administered 2015-01-02 (×2): 2 mg via INTRAVENOUS
  Filled 2015-01-02: qty 2

## 2015-01-02 MED ORDER — ARTIFICIAL TEARS OP OINT
TOPICAL_OINTMENT | Freq: Three times a day (TID) | OPHTHALMIC | Status: DC
  Administered 2015-01-02 – 2015-01-07 (×11): via OPHTHALMIC
  Administered 2015-01-07 – 2015-01-10 (×9): 1 via OPHTHALMIC
  Filled 2015-01-02 (×2): qty 3.5

## 2015-01-02 NOTE — Progress Notes (Signed)
Nutrition Brief Note  Chart reviewed. Pt now transitioning to comfort care.  No further nutrition interventions warranted at this time.  Please consult as needed.   Kalijah Westfall, MS, RD, LDN Pager: [phone] After Hours Pager: [phone]    

## 2015-01-02 NOTE — Progress Notes (Signed)
Patient ID: Antonio EpsteinJack Tyler, male   DOB: 07-Jun-1961, 54 y.o.   MRN: [id number]    Subjective: Antonio KidaJack is no longer bleeding and the urine is clear on minimal irrigation.  ROS:  Review of Systems  Unable to perform ROS: critical illness    Anti-infectives: Anti-infectives    Start     Dose/Rate Route Frequency Ordered Stop   12/28/14 0900  cefTRIAXone (ROCEPHIN) 1 g in dextrose 5 % 50 mL IVPB - Premix  Status:  Discontinued     1 g 100 mL/hr over 30 Minutes Intravenous Daily 12/28/14 0752 12/31/14 1626   12/26/14 1400  ceFEPIme (MAXIPIME) 1 g in dextrose 5 % 50 mL IVPB  Status:  Discontinued     1 g 100 mL/hr over 30 Minutes Intravenous 3 times per day 12/26/14 0951 12/28/14 0752   12/26/14 1200  vancomycin (VANCOCIN) IVPB 1000 mg/200 mL premix  Status:  Discontinued     1,000 mg 200 mL/hr over 60 Minutes Intravenous Every 8 hours 12/26/14 0951 12/28/14 0752   12/26/14 0300  ceFEPIme (MAXIPIME) 1 g in dextrose 5 % 50 mL IVPB  Status:  Discontinued     1 g 100 mL/hr over 30 Minutes Intravenous Every 24 hours 12/25/14 0253 12/25/14 1232   12/26/14 0300  vancomycin (VANCOCIN) IVPB 1000 mg/200 mL premix  Status:  Discontinued     1,000 mg 200 mL/hr over 60 Minutes Intravenous Every 24 hours 12/25/14 0253 12/25/14 1232   12/25/14 1700  ceFEPIme (MAXIPIME) 1 g in dextrose 5 % 50 mL IVPB  Status:  Discontinued     1 g 100 mL/hr over 30 Minutes Intravenous Every 12 hours 12/25/14 1232 12/26/14 0951   12/25/14 1600  vancomycin (VANCOCIN) IVPB 750 mg/150 ml premix  Status:  Discontinued     750 mg 150 mL/hr over 60 Minutes Intravenous Every 12 hours 12/25/14 1232 12/26/14 0951   12/25/14 0300  ceFEPIme (MAXIPIME) 2 g in dextrose 5 % 50 mL IVPB     2 g 100 mL/hr over 30 Minutes Intravenous  Once 12/25/14 0243 12/25/14 0430   12/25/14 0300  vancomycin (VANCOCIN) IVPB 1000 mg/200 mL premix     1,000 mg 200 mL/hr over 60 Minutes Intravenous  Once 12/25/14 0243 12/25/14 0500      Current  Facility-Administered Medications  Medication Dose Route Frequency Provider Last Rate Last Dose  . 0.9 %  sodium chloride infusion   Intravenous Continuous Ripudeep Jenna LuoK Rai, MD   Stopped at 01/01/15 1952  . antiseptic oral rinse (CPC / CETYLPYRIDINIUM CHLORIDE 0.05%) solution 7 mL  7 mL Mouth Rinse q12n4p Albertine GratesFang Xu, MD   7 mL at 01/01/15 1600  . chlorhexidine (PERIDEX) 0.12 % solution 15 mL  15 mL Mouth Rinse BID Albertine GratesFang Xu, MD   15 mL at 01/01/15 2003  . Chlorhexidine Gluconate Cloth 2 % PADS 6 each  6 each Topical Q0600 Ripudeep K Rai, MD   6 each at 01/01/15 0600  . haloperidol lactate (HALDOL) injection 1-4 mg  1-4 mg Intravenous Q3H PRN Oretha Milchakesh Alva V, MD   4 mg at 01/01/15 1111  . HYDROmorphone (DILAUDID) 25 mg in sodium chloride 0.9 % 50 mL (0.5 mg/mL) infusion  2-10 mg/hr Intravenous Continuous Edsel PetrinElizabeth L Golding, DO 10 mL/hr at 01/02/15 [id number] 5 mg/hr at 01/02/15 0620  . HYDROmorphone (DILAUDID) bolus via infusion 2 mg  2 mg Intravenous Q30 min PRN Edsel PetrinElizabeth L Golding, DO      . ipratropium-albuterol (DUONEB) 0.5-2.5 (3)  MG/3ML nebulizer solution 3 mL  3 mL Nebulization Q6H PRN Rolly Salter, MD      . midazolam (VERSED) 50 mg in sodium chloride 0.9 % 50 mL (1 mg/mL) infusion  1-5 mg/hr Intravenous Continuous Zigmund Gottron, MD 5 mL/hr at 01/02/15 0621 5 mg/hr at 01/02/15 0621  . mupirocin ointment (BACTROBAN) 2 % 1 application  1 application Nasal BID Ripudeep Jenna Luo, MD   1 application at 01/01/15 2227  . sodium chloride irrigation 0.9 % 3,000 mL  3,000 mL Irrigation Continuous Bjorn Pippin, MD         Objective: Vital signs in last 24 hours: Temp:  [98.4 F (36.9 C)-98.6 F (37 C)] 98.5 F (36.9 C) (04/10 0800) Pulse Rate:  [58-133] 79 (04/10 0741) Resp:  [11-24] 13 (04/10 0741) BP: (114-166)/(65-99) 151/77 mmHg (04/10 0312) SpO2:  [88 %-100 %] 99 % (04/10 0741) FiO2 (%):  [21 %] 21 % (04/10 0800)  Intake/Output from previous day: 04/09 0701 - 04/10 0700 In: 4613.1  [I.V.:313.1] Out: 6850 [Urine:6850] Intake/Output this shift: Total I/O In: 15 [I.V.:15] Out: -    Physical Exam  Constitutional:  He is no longer agitated  Genitourinary:  Urine is clear in foley tube  Vitals reviewed.   Lab Results:   Recent Labs  12/30/14 1730  HGB 7.2*  HCT 21.8*   BMET No results for input(s): NA, K, CL, CO2, GLUCOSE, BUN, CREATININE, CALCIUM in the last 72 hours. PT/INR No results for input(s): LABPROT, INR in the last 72 hours. ABG No results for input(s): PHART, HCO3 in the last 72 hours.  Invalid input(s): PCO2, PO2  Studies/Results: No results found.   Assessment: There is no further hematuria.   Plan: Since patient on comfort care and is likely to become oliguric so we should continue the CBI for now so that if he resumes bleeding he will be less likely to obstruct the catheter.      LOS: 8 days    Antonio Tyler J 01/02/2015

## 2015-01-02 NOTE — Progress Notes (Signed)
eLink Physician-Brief Progress Note Patient Name: Antonio EpsteinJack Tyler DOB: 12/02/1960 MRN: [id number]   Date of Service  01/02/2015  HPI/Events of Note  Patient with agitation despite versed and dilaudid drips.  eICU Interventions  Bolus of versed now.       Intervention Category Minor Interventions: Agitation / anxiety - evaluation and management  Henry RusselSMITH, Zakyah Yanes, P 01/02/2015, 9:54 PM

## 2015-01-02 NOTE — Progress Notes (Signed)
Met with Patient's family- his Antonio Tyler and Antonio Tyler who came in today from [place], [place]. His bother is expected to arrive on Wednesday. Goals are still full comfort care. I answered all of their questions and provided support. This gentleman is on Palliative Sedation due to extreme agitation -he has been in 4 restraints since admission at [facility] with very refractory agitation. The versed infusion has been the best way to keep him comfortable-PRN dosing has not been effective. Unclear why the versed infusion was discontinued last PM or the details surrounding this. He is comfortable at the current rate- he should be transferred to the floor but after review of last PM documentation there may be nursing related issues with this- will need to discuss with [facility] or unit charge. Terminal care using palliative sedation requires both an infusion of opiate and in this case Antonio benzodiazepine. I have discussed this process in detail with the family. Prognosis <2 weeks. Ideally this gentleman needs [facility] hosp[facility] facility-Antonio Tyler not comfortable giving that consent and wants to wait on his brother Antonio Tyler to make that decision.  Will continue to follow.  Lane Hacker, DO Palliative Medicine [phone]

## 2015-01-02 NOTE — Progress Notes (Signed)
eLink Physician-Brief Progress Note Patient Name: Antonio EpsteinJack Tyler DOB: 09-14-1961 MRN: [id number]   Date of Service  01/02/2015  HPI/Events of Note  Taken off the versed gtt per nursing request.  Now patient agitated and unable to be controlled on floor setting.  Back to unit.  Agitated.  eICU Interventions  Plan: Restart versed gtt Continue with dilaudid gtt Has prn haldol ordered as well.     Intervention Category Major Interventions: Delirium, psychosis, severe agitation - evaluation and management  DETERDING,ELIZABETH 01/02/2015, 6:09 AM

## 2015-01-02 NOTE — Progress Notes (Signed)
eLink Physician-Brief Progress Note Patient Name: Antonio Tyler DOB: March 15, 1961 MRN: [id number]   Date of Service  01/02/2015  HPI/Events of Note  Patient transferred out of ICU on dilaudid and versed gtts.  Floor staff not comfortable with combined gtts.  eICU Interventions  Plan: Continue dilaudid gtt Change versed to q1 hour prn     Intervention Category Intermediate Interventions: Pain - evaluation and management Minor Interventions: Agitation / anxiety - evaluation and management  DETERDING,ELIZABETH 01/02/2015, 3:11 AM

## 2015-01-02 NOTE — Progress Notes (Signed)
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 DATA:  NG tube insertion.  EXAM: PORTABLE ABDOMEN - 1 VIEW  COMPARISON:  12/25/2014.  FINDINGS: NG tube noted with its tip in the upper portion stomach. IVC filter noted with its tip at the L1-L2 level. No bowel distention. No free air. Mild right base atelectasis. Cardiomegaly.  IMPRESSION: 1. NG tube noted with its tip in the upper portion stomach. No bowel distention.  2.  Mild right base atelectasis.  Cardiomegaly.   Electronically Signed   By: Maisie Fus  Register   On: 12/27/2014 16:51   Dg Abd  Portable 1v  12/25/2014   CLINICAL DATA:  Evaluate nasojejunal tube.  EXAM: PORTABLE ABDOMEN - 1 VIEW  COMPARISON:  CT of earlier today.  FINDINGS: Presumed nasogastric tube terminates at the body of the stomach with side port in the proximal stomach. IVC filter identified.  Non-obstructive bowel gas pattern. Pelvis excluded. Cardiomegaly. Right hemidiaphragm elevation.  IMPRESSION: Presumed nasogastric tube terminating at the body of the stomach.   Electronically Signed   By: Jeronimo Greaves M.D.   On:  12/25/2014 17:11   Dg Swallowing Func-speech Pathology  12/30/2014    Objective Swallowing Evaluation:   Leonia Corona, SLP Student  Supervised and reviewed by Harlon Ditty MA CCC-SLP  Patient Details  Name: Antonio Tyler MRN: [id number] Date of Birth: 02-Jun-1961  Today's Date: 12/30/2014 Time: SLP Start Time (ACUTE ONLY): 0930-SLP Stop Time (ACUTE ONLY): 1000 SLP Time Calculation (min) (ACUTE ONLY): 30 min  Past Medical History:  Past Medical History  Diagnosis Date  . Hypertension   . Seizures   . Anxiety   . Traumatic brain injury   . Alcohol abuse    Past Surgical History: No past surgical history on file. HPI:  HPI: Pt is a 54 y/o male with PMH of TBI, SAH, subdural hemorrhage,  cerebral contusion, HTN, alcohol abuse, tracheostomy status, PEG tube  status, multiple rib fractures, T1 fracture, enterococcal bacteremia, and  IVC filter placement. Pt brought in from [facility] with chronic profuse  penile bleeding followng chronic foley change.   No Data Recorded  Assessment / Plan / Recommendation CHL IP CLINICAL IMPRESSIONS 12/30/2014  Dysphagia Diagnosis Severe pharyngeal phase dysphagia;Mild oral phase  dysphagia  Clinical impression Pt presents with mild oral phase and severe pharyngeal  phase dysphagia characterized by reduced bolus manipulation, base of  tongue weakness, reduced epiglottic inversion, reduced hyolarungeal  elevation and airway protection. Severity of impairments suspect caused by  reduced sensation, NG tube, weakened pharyngeal muscles due to disuse  atrophy, and cognitive deficits in combination with sedating medication.  Pt silently aspirated across all liquid consistencies. Pt unable to expel  aspirate and follow cues to cough. No aspiration noted with purees. Noted  increased vallecular residue across consistencies. Pt able to reduce  vallecular residue when given cue to swallow (dry spoon), however, pt  response inconsitent. Recommend continued trials of pureed consistencies  with speech  therapy only. Hopeful pt will be able to progress following NG  tube removal and better management of sedating medication.       CHL IP TREATMENT RECOMMENDATION 12/30/2014  Treatment Plan Recommendations Therapy as outlined in treatment plan below      [facility] IP DIET RECOMMENDATION 12/30/2014  Diet Recommendations Dysphagia 1 (Puree)  Liquid Administration via (None)  Medication Administration Via alternative means  Compensations (None)  Postural Changes and/or Swallow Maneuvers (None)     CHL IP OTHER RECOMMENDATIONS 12/29/2014  Recommended Consults MBS  Oral Care Recommendations (None)  Other Recommendations (None)     CHL IP FOLLOW UP RECOMMENDATIONS 12/30/2014  Follow up Recommendations Skilled Nursing facility     [facility] IP FREQUENCY AND DURATION 12/30/2014  Speech Therapy Frequency (ACUTE ONLY) min 2x/week  Treatment Duration 2 weeks     Pertinent Vitals/Pain NA    SLP Swallow Goals No flowsheet data found.  No flowsheet data found.    No flowsheet data found.   CHL IP ORAL PHASE 12/30/2014  Lips (None)  Tongue (None)  Mucous membranes (None)  Nutritional status (None)  Other (None)  Oxygen  therapy (None)  Oral Phase Impaired  Oral - Pudding Teaspoon (None)  Oral - Pudding Cup (None)  Oral - Honey Teaspoon Lingual/palatal residue;Other (Comment)  Oral - Honey Cup Lingual/palatal residue;Other (Comment)  Oral - Honey Syringe (None)  Oral - Nectar Teaspoon Lingual/palatal residue;Other (Comment)  Oral - Nectar Cup Lingual/palatal residue;Other (Comment)  Oral - Nectar Straw Lingual/palatal residue;Other (Comment)  Oral - Nectar Syringe (None)  Oral - Ice Chips (None)  Oral - Thin Teaspoon (None)  Oral - Thin Cup Lingual/palatal residue;Other (Comment)  Oral - Thin Straw (None)  Oral - Thin Syringe (None)  Oral - Puree Lingual/palatal residue;Other (Comment)  Oral - Mechanical Soft (None)  Oral - Regular (None)  Oral - Multi-consistency (None)  Oral - Pill (None)  Oral Phase - Comment (None)      CHL IP PHARYNGEAL PHASE 12/30/2014   Pharyngeal Phase Impaired  Pharyngeal - Pudding Teaspoon (None)  Penetration/Aspiration details (pudding teaspoon) (None)  Pharyngeal - Pudding Cup (None)  Penetration/Aspiration details (pudding cup) (None)  Pharyngeal - Honey Teaspoon Delayed swallow initiation;Premature spillage  to pyriform sinuses;Reduced epiglottic inversion;Pharyngeal residue -  valleculae;Reduced laryngeal elevation;Reduced airway/laryngeal  closure;Reduced tongue base retraction;Penetration/Aspiration during  swallow  Penetration/Aspiration details (honey teaspoon) Material enters airway,  passes BELOW cords and not ejected out despite cough attempt by patient  Pharyngeal - Honey Cup Delayed swallow initiation;Premature spillage to  pyriform sinuses;Reduced epiglottic inversion;Pharyngeal residue -  valleculae;Reduced laryngeal elevation;Reduced airway/laryngeal  closure;Reduced tongue base retraction;Penetration/Aspiration during  swallow  Penetration/Aspiration details (honey cup) Material enters airway, passes  BELOW cords and not ejected out despite cough attempt by patient  Pharyngeal - Honey Syringe (None)  Penetration/Aspiration details (honey syringe) (None)  Pharyngeal - Nectar Teaspoon (None)  Penetration/Aspiration details (nectar teaspoon) (None)  Pharyngeal - Nectar Cup Delayed swallow initiation;Premature spillage to  pyriform sinuses;Reduced epiglottic inversion;Pharyngeal residue -  valleculae;Reduced laryngeal elevation;Reduced airway/laryngeal  closure;Reduced tongue base retraction;Penetration/Aspiration during  swallow;Penetration/Aspiration before swallow  Penetration/Aspiration details (nectar cup) Material enters airway, passes  BELOW cords and not ejected out despite cough attempt by patient  Pharyngeal - Nectar Straw Delayed swallow initiation;Premature spillage to  pyriform sinuses;Reduced epiglottic inversion;Pharyngeal residue -  valleculae;Reduced laryngeal elevation;Reduced airway/laryngeal  closure;Reduced  tongue base retraction;Penetration/Aspiration before  swallow  Penetration/Aspiration details (nectar straw) Material enters airway,  passes BELOW cords and not ejected out despite cough attempt by patient  Pharyngeal - Nectar Syringe (None)  Penetration/Aspiration details (nectar syringe) (None)  Pharyngeal - Ice Chips (None)  Penetration/Aspiration details (ice chips) (None)  Pharyngeal - Thin Teaspoon (None)  Penetration/Aspiration details (thin teaspoon) (None)  Pharyngeal - Thin Cup Delayed swallow initiation;Premature spillage to  pyriform sinuses;Reduced epiglottic inversion;Reduced laryngeal  elevation;Reduced airway/laryngeal closure;Reduced tongue base  retraction;Penetration/Aspiration before swallow;Pharyngeal residue -  valleculae;Trace aspiration  Penetration/Aspiration details (thin cup) Material enters airway, passes  BELOW cords and not ejected out despite cough attempt by patient  Pharyngeal - Thin Straw (None)  Penetration/Aspiration details (thin straw) (None)  Pharyngeal - Thin Syringe (None)  Penetration/Aspiration details (thin syringe') (None)  Pharyngeal - Puree Delayed swallow initiation;Premature spillage to  pyriform sinuses;Reduced epiglottic inversion;Reduced laryngeal  elevation;Reduced airway/laryngeal closure;Reduced tongue base  retraction;Pharyngeal residue - valleculae  Penetration/Aspiration details (puree) (None)  Pharyngeal - Mechanical Soft (None)  Penetration/Aspiration details (mechanical soft) (None)  Pharyngeal - Regular (None)  Penetration/Aspiration details (regular) (None)  Pharyngeal - Multi-consistency (None)  Penetration/Aspiration details (multi-consistency) (None)  Pharyngeal - Pill (None)  Penetration/Aspiration details (pill) (None)  Pharyngeal Comment (None)     No  flowsheet data found.  No flowsheet data found.         DeBlois, Riley NearingBonnie Caroline 12/30/2014, 1:26 PM     CBC  Recent Labs Lab 12/28/14 0215 12/29/14 0500 12/29/14 1810 12/29/14 2217  12/30/14 0500 12/30/14 1730  WBC 5.6 3.5* 4.4 7.1 6.0  --   HGB 7.1* 7.1* 6.4* 8.3* 7.6* 7.2*  HCT 21.1* 21.4* 19.3* 24.3* 23.0* 21.8*  PLT 128* 85* 105* 111* 105*  --   MCV 90.9 91.8 91.5 90.3 89.8  --   MCH 30.6 30.5 30.3 30.9 29.7  --   MCHC 33.6 33.2 33.2 34.2 33.0  --   RDW 14.3 14.8 15.3 15.2 15.9*  --   LYMPHSABS  --   --  1.0  --   --   --   MONOABS  --   --  0.3  --   --   --   EOSABS  --   --  0.2  --   --   --   BASOSABS  --   --  0.0  --   --   --     Chemistries   Recent Labs Lab 12/27/14 0415 12/27/14 0955 12/28/14 0215 12/29/14 0500 12/30/14 0500  NA 146*  --  145 146* 148*  K 3.6  --  3.6 3.8 3.6  CL 109  --  111 113* 114*  CO2 30  --  29 29 28   GLUCOSE 124*  --  110* 104* 105*  BUN 13  --  10 10 7   CREATININE 0.51  --  0.99 1.11 1.22  CALCIUM 8.4  --  8.3* 8.1* 7.5*  MG 1.7  --   --   --   --   AST  --  37  --   --   --   ALT  --  35  --   --   --   ALKPHOS  --  84  --   --   --   BILITOT  --  0.8  --   --   --    ------------------------------------------------------------------------------------------------------------------ estimated creatinine clearance is 80.7 mL/min (by C-G formula based on Cr of 1.22). ------------------------------------------------------------------------------------------------------------------ No results for input(s): HGBA1C in the last 72 hours. ------------------------------------------------------------------------------------------------------------------ No results for input(s): CHOL, HDL, LDLCALC, TRIG, CHOLHDL, LDLDIRECT in the last 72 hours. ------------------------------------------------------------------------------------------------------------------ No results for input(s): TSH, T4TOTAL, T3FREE, THYROIDAB in the last 72 hours.  Invalid input(s): FREET3 ------------------------------------------------------------------------------------------------------------------ No results for input(s): VITAMINB12,  FOLATE, FERRITIN, TIBC, IRON, RETICCTPCT in the last 72 hours.  Coagulation profile  Recent Labs Lab 12/29/14 2217  INR 1.31    No results for input(s): DDIMER in the last 72 hours.  Cardiac Enzymes No results for input(s): CKMB, TROPONINI, MYOGLOBIN in the last 168 hours.  Invalid input(s): CK ------------------------------------------------------------------------------------------------------------------ Invalid input(s): POCBNP   Recent Labs  12/30/14 2004 12/30/14 2314 12/31/14 0337 12/31/14 0747 12/31/14 1151 12/31/14 1648  GLUCAP 110* 71 99 98 111* 96     Myangel Summons M.D. [facility] 01/02/2015, 9:37 AM  Pager: [phone]   Between 7am to 7pm - call Pager - [phone]  After 7pm go to [email or web address] - password [email or web address]  Call night coverage person covering after 7pm

## 2015-01-03 MED ORDER — MIDAZOLAM BOLUS VIA INFUSION
3.0000 mg | INTRAVENOUS | Status: DC | PRN
  Administered 2015-01-03 – 2015-01-05 (×12): 3 mg via INTRAVENOUS
  Filled 2015-01-03 (×13): qty 3

## 2015-01-03 MED ORDER — BISACODYL 10 MG RE SUPP: 10.0000 mg | Freq: Every day | RECTAL | Status: DC | PRN

## 2015-01-03 MED ORDER — HYDROMORPHONE BOLUS VIA INFUSION
2.0000 mg | INTRAVENOUS | Status: DC | PRN
  Administered 2015-01-03 – 2015-01-06 (×11): 2 mg via INTRAVENOUS
  Filled 2015-01-03 (×12): qty 2

## 2015-01-03 NOTE — Progress Notes (Signed)
CSW received referral for residential hospice placement.  CSW reviewed chart and spoke with PMT MD, [name].   Per PMT MD, pt family from out of town as [facility]. Per PMT MD, pt currently under palliative sedation and complex medical situation. Per PMT MD, PMT MD plans to discuss further with pt brother this afternoon and notify this CSW when appropriate to contact pt brother to discuss residential hospice with pt brother, but PMT MD feels that pt will be appropriate for residential hospice placement.  CSW to continue to follow and complete full psychosocial assessment when appropriate.  Loletta SpecterSuzanna Kidd, MSW, LCSW Clinical Social Work [phone]

## 2015-01-03 NOTE — Progress Notes (Signed)
Note for 01/02/15 Patient arrived on the unit at approximately 0215 from the ICU. Patient had bilateral protective mittens in place, CBI in progress and tracheostomy attached to a trach collar. Patient was very lethargic. Noted Dilaudid and Versed drips infusing. It was pointed out to the transferring nurse that we do not manage Versed drips on the unit. He stated he would get it discontinued which was done. Versed drip was stopped at 0317. Later, I was on my way to resident's room when I observed a staff member and two nurses from the ICU trying to prevent him from climbing out of bed. My staff nurse stated she was at the nurse's station when she noticed the patient climbing over the bed rail. A stat order for Versed was given with little effect as patient became calm for less than 10 minutes. He then became very combative and was trying to climb out of bed. Based on his erratic behavior, the other staff nurse and myself were forced to remain at bedside to prevent him causing harm to himself. The house supervisor was called and after assessing the situation , he instructed us to tranferr him back to the ICU. He left the floor at approximately 0545 for the unit.

## 2015-01-03 NOTE — Progress Notes (Signed)
CARE MANAGEMENT NOTE 01/03/2015  Patient:  Antonio Tyler,Antonio Tyler   Account Number:  [id number]  Date Initiated:  12/26/2014  Documentation initiated by:  [facility]  Subjective/Objective Assessment:   Admitted from a facility - for penile bleeding - shock.     Action/Plan:   Anticipated DC Date:  01/06/2015   Anticipated DC Plan:  HOSPICE MEDICAL FACILITY  In-house referral  Clinical Social Worker      DC Planning Services  CM consult      [place]  [facility]   Choice offered to / List presented to:             Status of service:  Completed, signed off Medicare Important Message given?   (If response is "NO", the following Medicare IM given date fields will be blank) Date Medicare IM given:   Medicare IM given by:   Date Additional Medicare IM given:   Additional Medicare IM given by:    Discharge Disposition:    Per UR Regulation:  Reviewed for med. necessity/level of care/duration of stay  If discussed at Long Length of Stay Meetings, dates discussed:   12/30/2014    Comments:  January 03, 2015/Natalee Tomkiewicz L. Earlene Plateravis, RN, BSN, CCM. Case Management [employer] [phone] No discharge needs present of time of review. will follow up on hospice needs   Contact:  Mitchum,John Brother [phone] 4/6 1316 debbie dowell rn,bsn spoke w brother [name] Beahm by phone. explained if all goes well pt will be dc back to [facility] on 4[facility]. brother had wanted pt to stay at hosp but explained [facility] only fixes immediate problems and [facility] works on long term goals. he states ok and aware will be dc back on 4-7 to [facility]. 4/6 1113 debbie dowell rn,bsn pt for dc back to [facility] on 6[facility]7. have placed cobra form on shadow chart. alerted andrea w [facility]. they can provide cont bladder irrigation. tried to reach brother [name] but no answer at present.  4/5 1420 debbie dowell rn,bsn pt from [facility] and they will take back at disch.

## 2015-01-03 NOTE — Progress Notes (Signed)
Pt extremely agitated, thrashing around in the bed.  [name] called with orders received to adjust his drips accordingly.

## 2015-01-03 NOTE — Plan of Care (Signed)
Problem: Phase I Progression Outcomes Goal: Pain controlled with appropriate interventions Outcome: Progressing Both Versed and Dilaudid gtts infusing and titrated as ordered.  Restraints removed this morning, as he was calm and safe to self.  Attempt made to wean sedation, but pt awakens and thrashes about in bed, unable to calm with verbal support.  Versed bolus' given as ordered and increased to keep patient comfortable and safe to self.  Continuous Bladder irrigation held, as urine has been clear, yellow throughout the shift.  Will continue to monitor closely and restart if need arises. Goal: Psychosocial & spiritual needs assessed Outcome: Progressing Much reassurance given during the day to the patient.  This support does not seem to help in calming patient, medicine is most effective.

## 2015-01-03 NOTE — Progress Notes (Signed)
01/02/2015 @ 2200 - Called to E-link, regarding pt continues be agitated, thrashing around in bed, attempting to pull at trach and pulled cardiac monitor and pulse ox probe off repeatedly.  Dilaudid and Versed gtts have been maxed out at 10mg /hr and 5mg /hr respectively with 3mg  IV bolus of Versed given at 2150, with no change in situation.  Also Haldol 4mg  IV given earlier in the night.  Unable to attempt to reason with patient.  Hx of TBI.  Dr. Katrinka BlazingSmith (PCCM) gave telephone order on 01/02/2015 @ 2200 to place pt in medical non-violent restraints, bilat. Wrists, due to pt pulling at lines and tubes and disrupting medical care.  Dr. Katrinka BlazingSmith advised he would enter the order.  Order was not entered into chart.  This progress note is to serve as documentation of said order.  Jewel BaizeHans C Beniah Magnan,RN,BSN,CCRN

## 2015-01-03 NOTE — Progress Notes (Signed)
Responded to call re: increasing agitation. Patient on palliative sedation due to severe and refractory agitation that has led to his hospitalization(he pulled his PEG and Foley-causing damage)- this is a treatment of last resort when symptom distress cannot be relieved using standard methods. He is currently maxed out on current orders for dilaudid infusion and versed infusion. I adjusted parameters on the dilaudid and versed infusions and increased bolus doing and frequency intervals. I discussed titration plan with RN. If he remains refractory to the infusions he may be developing tachyphylaxis in that case will need to switch him to a propofol infusion for sedation. Additionally I have advised to manually irrigate his foley catheter and assess for urinary retension or problems with CBI. Other reversible conditions need troubleshooting at bedside- ie. Need for BM, tracheostomy suctioning, oral care, or occult sources of pain in a patient who has no ability to communicate his needs. Goals are full comfort and to allow for a natural death to occur from the multiple complications of his severe irreversible brain injury. Family consented verbally for sedation and comfort care. This gentleman would be best served by a hospice facility.  Antonio MaltaElizabeth Charleston Vierling, DO Palliative Medicine [phone]

## 2015-01-03 NOTE — Progress Notes (Signed)
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  COMPARISON:  12/25/2014.  FINDINGS: NG tube noted with its tip in the upper portion stomach. IVC filter noted with its tip at the L1-L2 level. No bowel distention. No free air. Mild right base atelectasis. Cardiomegaly.  IMPRESSION: 1. NG tube noted with its tip in the upper portion stomach. No bowel distention.  2.  Mild right base atelectasis.  Cardiomegaly.   Electronically Signed   By: Maisie Fus  Register   On: 12/27/2014 16:51   Dg Abd Portable  1v  12/25/2014   CLINICAL DATA:  Evaluate nasojejunal tube.  EXAM: PORTABLE ABDOMEN - 1 VIEW  COMPARISON:  CT of earlier today.  FINDINGS: Presumed nasogastric tube terminates at the body of the stomach with side port in the proximal stomach. IVC filter identified.  Non-obstructive bowel gas pattern. Pelvis excluded. Cardiomegaly. Right hemidiaphragm elevation.  IMPRESSION: Presumed nasogastric tube terminating at the body of the stomach.   Electronically Signed   By: Jeronimo Greaves M.D.   On: 12/25/2014 17:11   Dg Swallowing Func-speech Pathology  12/30/2014     Objective Swallowing Evaluation:   Leonia Corona, SLP Student  Supervised and reviewed by Harlon Ditty MA CCC-SLP  Patient Details  Name: Searcy Miyoshi MRN: [id number] Date of Birth: 09-27-1960  Today's Date: 12/30/2014 Time: SLP Start Time (ACUTE ONLY): 0930-SLP Stop Time (ACUTE ONLY): 1000 SLP Time Calculation (min) (ACUTE ONLY): 30 min  Past Medical History:  Past Medical History  Diagnosis Date  . Hypertension   . Seizures   . Anxiety   . Traumatic brain injury   . Alcohol abuse    Past Surgical History: No past surgical history on file. HPI:  HPI: Pt is a 54 y/o male with PMH of TBI, SAH, subdural hemorrhage,  cerebral contusion, HTN, alcohol abuse, tracheostomy status, PEG tube  status, multiple rib fractures, T1 fracture, enterococcal bacteremia, and  IVC filter placement. Pt brought in from [facility] with chronic profuse  penile bleeding followng chronic foley change.   No Data Recorded  Assessment / Plan / Recommendation CHL IP CLINICAL IMPRESSIONS 12/30/2014  Dysphagia Diagnosis Severe pharyngeal phase dysphagia;Mild oral phase  dysphagia  Clinical impression Pt presents with mild oral phase and severe pharyngeal  phase dysphagia characterized by reduced bolus manipulation, base of  tongue weakness, reduced epiglottic inversion, reduced hyolarungeal  elevation and airway protection. Severity of impairments suspect caused by  reduced sensation, NG tube, weakened pharyngeal muscles due to disuse  atrophy, and cognitive deficits in combination with sedating medication.  Pt silently aspirated across all liquid consistencies. Pt unable to expel  aspirate and follow cues to cough. No aspiration noted with purees. Noted  increased vallecular residue across consistencies. Pt able to reduce  vallecular residue when given cue to swallow (dry spoon), however, pt  response inconsitent. Recommend continued trials of pureed consistencies  with speech therapy only. Hopeful pt will be able to progress following NG  tube  removal and better management of sedating medication.       CHL IP TREATMENT RECOMMENDATION 12/30/2014  Treatment Plan Recommendations Therapy as outlined in treatment plan below      CHL IP DIET RECOMMENDATION 12/30/2014  Diet Recommendations Dysphagia 1 (Puree)  Liquid Administration via (None)  Medication Administration Via alternative means  Compensations (None)  Postural Changes and/or Swallow Maneuvers (None)     CHL IP OTHER RECOMMENDATIONS 12/29/2014  Recommended Consults MBS  Oral Care Recommendations (None)  Other Recommendations (None)     CHL IP FOLLOW UP RECOMMENDATIONS 12/30/2014  Follow up Recommendations Skilled Nursing facility     [facility] IP FREQUENCY AND DURATION 12/30/2014  Speech Therapy Frequency (ACUTE ONLY) min 2x/week  Treatment Duration 2 weeks     Pertinent Vitals/Pain NA    SLP Swallow Goals No flowsheet data found.  No flowsheet data found.    No flowsheet data found.   CHL IP ORAL PHASE 12/30/2014  Lips (None)  Tongue (None)  Mucous membranes (None)  Nutritional status (None)  Other (None)  Oxygen therapy (  None)  Oral Phase Impaired  Oral - Pudding Teaspoon (None)  Oral - Pudding Cup (None)  Oral - Honey Teaspoon Lingual/palatal residue;Other (Comment)  Oral - Honey Cup Lingual/palatal residue;Other (Comment)  Oral - Honey Syringe (None)  Oral - Nectar Teaspoon Lingual/palatal residue;Other (Comment)  Oral - Nectar Cup Lingual/palatal residue;Other (Comment)  Oral - Nectar Straw Lingual/palatal residue;Other (Comment)  Oral - Nectar Syringe (None)  Oral - Ice Chips (None)  Oral - Thin Teaspoon (None)  Oral - Thin Cup Lingual/palatal residue;Other (Comment)  Oral - Thin Straw (None)  Oral - Thin Syringe (None)  Oral - Puree Lingual/palatal residue;Other (Comment)  Oral - Mechanical Soft (None)  Oral - Regular (None)  Oral - Multi-consistency (None)  Oral - Pill (None)  Oral Phase - Comment (None)      CHL IP PHARYNGEAL PHASE 12/30/2014  Pharyngeal Phase Impaired  Pharyngeal - Pudding Teaspoon (None)   Penetration/Aspiration details (pudding teaspoon) (None)  Pharyngeal - Pudding Cup (None)  Penetration/Aspiration details (pudding cup) (None)  Pharyngeal - Honey Teaspoon Delayed swallow initiation;Premature spillage  to pyriform sinuses;Reduced epiglottic inversion;Pharyngeal residue -  valleculae;Reduced laryngeal elevation;Reduced airway/laryngeal  closure;Reduced tongue base retraction;Penetration/Aspiration during  swallow  Penetration/Aspiration details (honey teaspoon) Material enters airway,  passes BELOW cords and not ejected out despite cough attempt by patient  Pharyngeal - Honey Cup Delayed swallow initiation;Premature spillage to  pyriform sinuses;Reduced epiglottic inversion;Pharyngeal residue -  valleculae;Reduced laryngeal elevation;Reduced airway/laryngeal  closure;Reduced tongue base retraction;Penetration/Aspiration during  swallow  Penetration/Aspiration details (honey cup) Material enters airway, passes  BELOW cords and not ejected out despite cough attempt by patient  Pharyngeal - Honey Syringe (None)  Penetration/Aspiration details (honey syringe) (None)  Pharyngeal - Nectar Teaspoon (None)  Penetration/Aspiration details (nectar teaspoon) (None)  Pharyngeal - Nectar Cup Delayed swallow initiation;Premature spillage to  pyriform sinuses;Reduced epiglottic inversion;Pharyngeal residue -  valleculae;Reduced laryngeal elevation;Reduced airway/laryngeal  closure;Reduced tongue base retraction;Penetration/Aspiration during  swallow;Penetration/Aspiration before swallow  Penetration/Aspiration details (nectar cup) Material enters airway, passes  BELOW cords and not ejected out despite cough attempt by patient  Pharyngeal - Nectar Straw Delayed swallow initiation;Premature spillage to  pyriform sinuses;Reduced epiglottic inversion;Pharyngeal residue -  valleculae;Reduced laryngeal elevation;Reduced airway/laryngeal  closure;Reduced tongue base retraction;Penetration/Aspiration before  swallow   Penetration/Aspiration details (nectar straw) Material enters airway,  passes BELOW cords and not ejected out despite cough attempt by patient  Pharyngeal - Nectar Syringe (None)  Penetration/Aspiration details (nectar syringe) (None)  Pharyngeal - Ice Chips (None)  Penetration/Aspiration details (ice chips) (None)  Pharyngeal - Thin Teaspoon (None)  Penetration/Aspiration details (thin teaspoon) (None)  Pharyngeal - Thin Cup Delayed swallow initiation;Premature spillage to  pyriform sinuses;Reduced epiglottic inversion;Reduced laryngeal  elevation;Reduced airway/laryngeal closure;Reduced tongue base  retraction;Penetration/Aspiration before swallow;Pharyngeal residue -  valleculae;Trace aspiration  Penetration/Aspiration details (thin cup) Material enters airway, passes  BELOW cords and not ejected out despite cough attempt by patient  Pharyngeal - Thin Straw (None)  Penetration/Aspiration details (thin straw) (None)  Pharyngeal - Thin Syringe (None)  Penetration/Aspiration details (thin syringe') (None)  Pharyngeal - Puree Delayed swallow initiation;Premature spillage to  pyriform sinuses;Reduced epiglottic inversion;Reduced laryngeal  elevation;Reduced airway/laryngeal closure;Reduced tongue base  retraction;Pharyngeal residue - valleculae  Penetration/Aspiration details (puree) (None)  Pharyngeal - Mechanical Soft (None)  Penetration/Aspiration details (mechanical soft) (None)  Pharyngeal - Regular (None)  Penetration/Aspiration details (regular) (None)  Pharyngeal - Multi-consistency (None)  Penetration/Aspiration details (multi-consistency) (None)  Pharyngeal - Pill (None)  Penetration/Aspiration details (pill) (None)  Pharyngeal Comment (None)     No flowsheet  data found.  No flowsheet data found.         DeBlois, Riley NearingBonnie Caroline 12/30/2014, 1:26 PM     CBC  Recent Labs Lab 12/28/14 0215 12/29/14 0500 12/29/14 1810 12/29/14 2217 12/30/14 0500 12/30/14 1730  WBC 5.6 3.5* 4.4 7.1 6.0  --   HGB 7.1*  7.1* 6.4* 8.3* 7.6* 7.2*  HCT 21.1* 21.4* 19.3* 24.3* 23.0* 21.8*  PLT 128* 85* 105* 111* 105*  --   MCV 90.9 91.8 91.5 90.3 89.8  --   MCH 30.6 30.5 30.3 30.9 29.7  --   MCHC 33.6 33.2 33.2 34.2 33.0  --   RDW 14.3 14.8 15.3 15.2 15.9*  --   LYMPHSABS  --   --  1.0  --   --   --   MONOABS  --   --  0.3  --   --   --   EOSABS  --   --  0.2  --   --   --   BASOSABS  --   --  0.0  --   --   --     Chemistries   Recent Labs Lab 12/27/14 0955 12/28/14 0215 12/29/14 0500 12/30/14 0500  NA  --  145 146* 148*  K  --  3.6 3.8 3.6  CL  --  111 113* 114*  CO2  --  29 29 28   GLUCOSE  --  110* 104* 105*  BUN  --  10 10 7   CREATININE  --  0.99 1.11 1.22  CALCIUM  --  8.3* 8.1* 7.5*  AST 37  --   --   --   ALT 35  --   --   --   ALKPHOS 84  --   --   --   BILITOT 0.8  --   --   --    ------------------------------------------------------------------------------------------------------------------ estimated creatinine clearance is 80.7 mL/min (by C-G formula based on Cr of 1.22). ------------------------------------------------------------------------------------------------------------------ No results for input(s): HGBA1C in the last 72 hours. ------------------------------------------------------------------------------------------------------------------ No results for input(s): CHOL, HDL, LDLCALC, TRIG, CHOLHDL, LDLDIRECT in the last 72 hours. ------------------------------------------------------------------------------------------------------------------ No results for input(s): TSH, T4TOTAL, T3FREE, THYROIDAB in the last 72 hours.  Invalid input(s): FREET3 ------------------------------------------------------------------------------------------------------------------ No results for input(s): VITAMINB12, FOLATE, FERRITIN, TIBC, IRON, RETICCTPCT in the last 72 hours.  Coagulation profile  Recent Labs Lab 12/29/14 2217  INR 1.31    No results for input(s): DDIMER in the  last 72 hours.  Cardiac Enzymes No results for input(s): CKMB, TROPONINI, MYOGLOBIN in the last 168 hours.  Invalid input(s): CK ------------------------------------------------------------------------------------------------------------------ Invalid input(s): POCBNP   Recent Labs  12/31/14 1151 12/31/14 1648  GLUCAP 111* 96     Dameer Speiser M.D. [facility] 01/03/2015, 8:38 AM  Pager: [phone]   Between 7am to 7pm - call Pager - [phone]  After 7pm go to [email or web address] - password [email or web address]  Call night coverage person covering after 7pm

## 2015-01-03 NOTE — Progress Notes (Signed)
IR PA aware of change in the goals of care, now comfort care status and placed on sedation infusion, NG tube was also removed, Cystoscopy, labs, fluids were canceled and no longer wanting G-tube tube placement at this time per chart review. I will discontinue order and if wanted, please place new order or call IR [phone]  Pattricia BossKoreen Savannah Erbe PA-C Interventional Radiology  01/03/15  10:34 AM

## 2015-01-04 DIAGNOSIS — R579 Shock, unspecified: Secondary | ICD-10-CM

## 2015-01-04 LAB — TYPE AND SCREEN
ABO/RH(D): A POS
ANTIBODY SCREEN: NEGATIVE
Unit division: 0

## 2015-01-04 MED ORDER — CHLORHEXIDINE GLUCONATE CLOTH 2 % EX PADS
6.0000 | MEDICATED_PAD | Freq: Every day | CUTANEOUS | Status: DC
  Administered 2015-01-04: 6 via TOPICAL

## 2015-01-04 NOTE — Progress Notes (Signed)
CSW received consult for residential hospice placement.   CSW spoke with PMT MD, [name] who stated that she was unable to reach pt brother, [name] yesterday to further discuss plans for transition to residential hospice facility. Per [name], pt brother had reported in previous conversations that he would be coming to hospital on Wednesday 4/13. Per [name], [name] [facility] if pt is not on continuous bladder irrigation.   CSW attempted to contact pt brother, Remi DeterJohn Fuquay via telephone today, but unable to reach. CSW attempted to contact pt aunt, [name], but unable to reach at this time.   CSW spoke to [facility], Forrestine Himva Davis who confirmed that she is aware of referral and [facility], but pt family has to be present to sign admission documents.   CSW notified [facility] regarding pt as well in order for [facility] to be aware of potential referral if [facility].   CSW to continue to follow and complete full psychosocial assessment when able to speak with pt brother regarding plan.  Loletta SpecterSuzanna Kidd, MSW, LCSW Clinical Social Work [phone]

## 2015-01-04 NOTE — Progress Notes (Addendum)
Attempted to call Mardene SpeakJohn Scardina-no answer. Also spoke with [facility] CSW-no CBI but they can do manual bladder irrigation. Will see if CBI can be transitioned to scheduled standard syringe BI since bleeding appears to have stopped. He continues to have periods of agitation requiring bolus doses of versed. I adjusted to infusion and bolus parameters slightly today and discussed symptom management plan with RN in detail. Patient should remain in ICU for now due to fluctuating symptoms and need for frequent and continuous monitoring/titration of IV comfort medications.  Will follow closely.  Anderson MaltaElizabeth Kathalina Ostermann, DO Palliative Medicine [phone]

## 2015-01-04 NOTE — Progress Notes (Signed)
Patient seen at bedside, update/discussion with primary RN done.  Patient appears comfortable at this time, BP 112/64, HR 70.  Dilaudid gtt infusing at 12mg /hr, Versed gtt infusing at 10mg /hr.  His last Versed bolus for agitation was 0515am. Nursing does not report any increase in agitation or distressful symptoms during day shift hours today, and acknowledge the goal oc care to be primary symptom management with palliative sedation.  Comfort measures in place, order to call palliative care with any increase in symptom issues that require further orders.  Expect family members to arrive from out of town in the next day.  Careful observation and inpatient management to prevent any escalation in symptoms is required at this time.   Palliative Care will continue to follow.  Cindra PresumeSue Ellen Melvyn Hommes, RN, MSN, [facility] Palliative Care

## 2015-01-04 NOTE — Progress Notes (Signed)
Patient ID: Antonio Tyler, male   DOB: 06-23-1961, 54 y.o.   MRN: [id number]   Daily Progress Note   Patient Name: Antonio Tyler  DOB: May 25, 1961          Age: 54 y.o.             Attending Physician: Cathren Harsh, MD MRN#: [id number]                                            Primary Tyler Physician: No primary Tyler provider on file. Admit Date: 12/24/2014  Date: 01/04/2015  Reason for Consultation/Follow-up: Establishing goals of Tyler and Terminal Tyler  Subjective: Antonio Tyler continues to need high levels of sedation and pain control for his refractory agitation and terminal complications following TBI. He does not follow commands-he has periods of agitation escalation despite bolus dosing and titration of his comfort medications. Interval Events:  Admitted 4/1-continuous bladder irrigation started, shock from profuse bleeding from urethral injury  PEG tube not replaceable until tract healed due to liver injury, NG was placed  Goals of Tyler conversation with his brother Antonio Tyler and aunt Antonio Tyler 4/8-comfort Tyler initiated Verded and dilaudid infusion started, CBI continued due to large clots  CBI discontinued and urinary bleeding stopped on 4/12, continued to require infusion titration for severe agitation  Update to family by phone 4/12, awaiting arrival of patient's brother who is traveling from [place]    10 days LOS  Current Medications: Scheduled Meds:  . antiseptic oral rinse  7 mL Mouth Rinse q12n4p  . artificial tears   Both Eyes 3 times per day  . chlorhexidine  15 mL Mouth Rinse BID  . Chlorhexidine Gluconate Cloth  6 each Topical Q0600  . mupirocin ointment  1 application Nasal BID    Continuous Infusions: . sodium chloride Stopped (01/01/15 1952)  . HYDROmorphone 13 mg/hr (01/04/15 2000)  . midazolam (VERSED) infusion 10 mg/hr (01/04/15 2350)  . sodium chloride irrigation      PRN Meds: bisacodyl, haloperidol lactate, HYDROmorphone, ipratropium-albuterol, midazolam  Stool Softner:  yes  Palliative Performance Scale: 10%   Vital Signs: BP 97/55 mmHg  Pulse 69  Temp(Src) 98.3 F (36.8 C) (Oral)  Resp 13  Ht  (1.727 m)  Wt 96.5 kg (212 lb 11.9 oz)  BMI 32.36 kg/m2  SpO2 100% SpO2: SpO2: 100 % O2 Device: O2 Device: Tracheostomy Collar O2 Flow Rate: O2 Flow Rate (L/min): 5 L/min  Intake/output summary:  Intake/Output Summary (Last 24 hours) at 01/04/15 2359 Last data filed at 01/04/15 2100  Gross per 24 hour  Intake 293.73 ml  Output   1153 ml  Net -859.27 ml    LBM:    Baseline Weight: Weight: 91.5 kg (201 lb 11.5 oz) Most recent weight: Weight: 96.5 kg (212 lb 11.9 oz)  Physical Exam: Antonio Tyler is sedated but remains arousable on high levels of continuous pain and sedation medications. Janina Mayo site looks clean- no excess secretions, developing rhonchi, respiratory rate is around 10, he is very pale, some early mottling noticed.               Additional Data Reviewed: No results for input(s): WBC, HGB, PLT, NA, BUN, CREATININE, ALB in the last 72 hours.   Problem List:  Patient Active Problem List   Diagnosis Date Noted  . Sepsis 12/29/2014  . Restlessness and agitation   .  Psychomotor agitation   . Dysphagia   . Hemorrhagic shock 12/25/2014  . Shock 12/25/2014  . TBI (traumatic brain injury) 12/25/2014  . SAH (subarachnoid hemorrhage) 12/25/2014  . Abnormal CT scan, head 12/25/2014  . Acute blood loss anemia 12/25/2014  . Bladder injury 12/25/2014  . S/P PICC central line placement, tracking in right IJ,  12/25/2014  . Hematuria 12/25/2014  . Seizure disorder 12/25/2014  . Tracheostomy in place 12/25/2014  . HCAP (healthcare-associated pneumonia) 12/25/2014  . Chronic pain syndrome 12/25/2014  . H/O ETOH abuse 12/25/2014  . Bedridden 12/25/2014  . h/o Enterococcal bacteremia 12/25/2014     Palliative Tyler Assessment & Plan    1. Code Status: DNR  2. Goals of Tyler:  Full comfort Tyler  Allow for a natural death to occur given  very poor prognosis and chance for meaningul recovery back to a QOL that Antonio Tyler.  Desire for further Chaplaincy support:no  3. Symptom Management:  Dilaudid infusion currently at 13mg /hr  Versed infusion at 10mg /hr  Bolus doses and titratuon orders have been clarified  4. Palliative Prophylaxis:  Stool Softner: yes  5. Prognosis: Hours - Days  5. Disposition Recommendations: Hospice facility- Antonio Tyler including continuation of his infusions in that setting- maintain his central line-do not remove.  Anticipate Hospital Death: Possible  Impression and plan was discussed with RN and CSW. Call placed and message left with his Antonio Tyler Antonio Tyler and his brother Antonio RuizJohn- communication has been difficult with family due to their distance and circumstances-Antonio Tyler has no family support or presence at bedside.  Thank you for allowing the Palliative Medicine Team to assist in the Tyler of this patient.    Antonio PetrinElizabeth L Golding, DO  01/04/2015, 11:59 PM  Please contact Palliative Medicine Team phone at [phone] for questions and concerns.

## 2015-01-04 NOTE — Progress Notes (Signed)
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 [id number] Date of Birth: 08-01-1961  Today's Date: 12/30/2014 Time: SLP Start Time (ACUTE ONLY): 0930-SLP Stop Time (ACUTE ONLY): 1000 SLP Time Calculation (min) (ACUTE ONLY): 30 min  Past Medical History:  Past Medical History  Diagnosis Date  . Hypertension   . Seizures   . Anxiety   . Traumatic brain injury   . Alcohol abuse    Past Surgical History: No past surgical history on file. HPI:  HPI: Pt is a 54 y/o male with PMH of TBI, SAH, subdural hemorrhage,  cerebral contusion, HTN, alcohol abuse, tracheostomy status, PEG tube  status, multiple rib fractures, T1 fracture, enterococcal bacteremia, and  IVC filter placement. Pt brought  in from [facility] with chronic profuse  penile bleeding followng chronic foley change.   No Data Recorded  Assessment / Plan / Recommendation [facility] IP CLINICAL IMPRESSIONS 12/30/2014  Dysphagia Diagnosis Severe pharyngeal phase dysphagia;Mild oral phase  dysphagia  Clinical impression Pt presents with mild oral phase and severe pharyngeal  phase dysphagia characterized by reduced bolus  manipulation, base of  tongue weakness, reduced epiglottic inversion, reduced hyolarungeal  elevation and airway protection. Severity of impairments suspect caused by  reduced sensation, NG tube, weakened pharyngeal muscles due to disuse  atrophy, and cognitive deficits in combination with sedating medication.  Pt silently aspirated across all liquid consistencies. Pt unable to expel  aspirate and follow cues to cough. No aspiration noted with purees. Noted  increased vallecular residue across consistencies. Pt able to reduce  vallecular residue when given cue to swallow (dry spoon), however, pt  response inconsitent. Recommend continued trials of pureed consistencies  with speech therapy only. Hopeful pt will be able to progress following NG  tube removal and better management of sedating medication.       [facility] IP TREATMENT RECOMMENDATION 12/30/2014  Treatment Plan Recommendations Therapy as outlined in treatment plan below      [facility] IP DIET RECOMMENDATION 12/30/2014  Diet Recommendations Dysphagia 1 (Puree)  Liquid Administration via (None)  Medication Administration Via alternative means  Compensations (None)  Postural Changes and/or Swallow Maneuvers (None)     [facility] IP OTHER RECOMMENDATIONS 12/29/2014  Recommended Consults MBS  Oral Care Recommendations (None)  Other Recommendations (None)     [facility] IP FOLLOW UP RECOMMENDATIONS 12/30/2014  Follow up Recommendations Skilled Nursing facility     [facility] IP FREQUENCY AND DURATION 12/30/2014  Speech Therapy Frequency (ACUTE ONLY) min 2x/week  Treatment Duration 2 weeks     Pertinent Vitals/Pain NA    SLP Swallow Goals No flowsheet data found.  No flowsheet data found.    No flowsheet data found.   [facility] IP ORAL PHASE 12/30/2014  Lips (None)  Tongue (None)  Mucous membranes (None)  Nutritional status (None)  Other (None)  Oxygen therapy (None)  Oral Phase Impaired  Oral - Pudding Teaspoon (None)  Oral - Pudding Cup (None)  Oral - Honey Teaspoon Lingual/palatal residue;Other (Comment)  Oral  - Honey Cup Lingual/palatal residue;Other (Comment)  Oral - Honey Syringe (None)  Oral - Nectar Teaspoon Lingual/palatal residue;Other (Comment)  Oral - Nectar Cup Lingual/palatal residue;Other (Comment)  Oral - Nectar Straw Lingual/palatal residue;Other (Comment)  Oral - Nectar Syringe (None)  Oral - Ice Chips (None)  Oral - Thin Teaspoon (None)  Oral - Thin Cup Lingual/palatal residue;Other (Comment)  Oral - Thin Straw (None)  Oral - Thin Syringe (None)  Oral - Puree Lingual/palatal residue;Other (Comment)  Oral - Mechanical Soft (None)  Oral - Regular (None)  Oral - Multi-consistency (None)  Oral - Pill (None)  Oral Phase - Comment (None)      [facility] IP PHARYNGEAL PHASE 12/30/2014  Pharyngeal Phase Impaired  Pharyngeal - Pudding Teaspoon (None)  Penetration/Aspiration details (pudding teaspoon) (None)  Pharyngeal - Pudding Cup (None)  Penetration/Aspiration details (pudding cup) (None)  Pharyngeal - Honey Teaspoon Delayed swallow initiation;Premature spillage  to pyriform sinuses;Reduced epiglottic inversion;Pharyngeal residue -  valleculae;Reduced laryngeal elevation;Reduced airway/laryngeal  closure;Reduced tongue base retraction;Penetration/Aspiration during  swallow  Penetration/Aspiration details (honey teaspoon) Material enters airway,  passes BELOW cords and not ejected out despite cough attempt by patient  Pharyngeal - Honey Cup Delayed swallow initiation;Premature spillage to  pyriform sinuses;Reduced epiglottic inversion;Pharyngeal residue -  valleculae;Reduced laryngeal elevation;Reduced airway/laryngeal  closure;Reduced tongue base retraction;Penetration/Aspiration during  swallow  Penetration/Aspiration details (honey cup) Material enters airway, passes  BELOW cords and not ejected out despite cough attempt by patient  Pharyngeal - Honey Syringe (None)  Penetration/Aspiration details (honey syringe) (None)  Pharyngeal - Nectar Teaspoon (None)  Penetration/Aspiration details (nectar teaspoon) (None)   Pharyngeal - Nectar Cup Delayed swallow initiation;Premature spillage to  pyriform sinuses;Reduced epiglottic inversion;Pharyngeal residue -  valleculae;Reduced laryngeal elevation;Reduced airway/laryngeal  closure;Reduced tongue base retraction;Penetration/Aspiration during  swallow;Penetration/Aspiration before swallow  Penetration/Aspiration details (nectar cup) Material enters airway, passes  BELOW cords and not ejected out despite cough attempt by patient  Pharyngeal - Nectar Straw Delayed swallow initiation;Premature spillage to  pyriform sinuses;Reduced epiglottic inversion;Pharyngeal residue -  valleculae;Reduced laryngeal elevation;Reduced airway/laryngeal  closure;Reduced tongue base retraction;Penetration/Aspiration before  swallow  Penetration/Aspiration details (nectar straw) Material enters airway,  passes BELOW cords and not ejected out despite cough attempt by patient  Pharyngeal - Nectar Syringe (None)  Penetration/Aspiration details (nectar syringe) (None)  Pharyngeal - Ice Chips (None)  Penetration/Aspiration details (ice chips) (None)  Pharyngeal - Thin Teaspoon (None)  Penetration/Aspiration details (thin teaspoon) (None)  Pharyngeal - Thin Cup Delayed swallow initiation;Premature spillage to  pyriform sinuses;Reduced epiglottic inversion;Reduced laryngeal  elevation;Reduced airway/laryngeal closure;Reduced tongue base  retraction;Penetration/Aspiration before swallow;Pharyngeal residue -  valleculae;Trace aspiration  Penetration/Aspiration details (thin cup) Material enters airway, passes  BELOW cords and not ejected out despite cough attempt by patient  Pharyngeal - Thin Straw (None)  Penetration/Aspiration details (thin straw) (None)  Pharyngeal - Thin Syringe (None)  Penetration/Aspiration details (thin syringe') (None)  Pharyngeal - Puree Delayed swallow initiation;Premature spillage to  pyriform sinuses;Reduced epiglottic inversion;Reduced laryngeal  elevation;Reduced airway/laryngeal  closure;Reduced tongue base  retraction;Pharyngeal residue - valleculae  Penetration/Aspiration details (puree) (None)  Pharyngeal - Mechanical Soft (None)  Penetration/Aspiration details (mechanical soft) (None)  Pharyngeal - Regular (None)  Penetration/Aspiration details (regular) (None)  Pharyngeal - Multi-consistency (None)  Penetration/Aspiration details (multi-consistency) (None)  Pharyngeal - Pill (None)  Penetration/Aspiration details (pill) (None)  Pharyngeal Comment (None)     No flowsheet data found.  No flowsheet data found.         DeBlois, Riley Nearing 12/30/2014, 1:26 PM     CBC  Recent Labs Lab 12/29/14 0500 12/29/14 1810 12/29/14 2217 12/30/14 0500 12/30/14 1730  WBC 3.5* 4.4 7.1 6.0  --   HGB 7.1* 6.4* 8.3* 7.6* 7.2*  HCT 21.4* 19.3* 24.3* 23.0* 21.8*  PLT 85* 105* 111* 105*  --   MCV 91.8 91.5 90.3 89.8  --   MCH 30.5 30.3 30.9 29.7  --   MCHC 33.2 33.2 34.2 33.0  --   RDW 14.8 15.3 15.2 15.9*  --   LYMPHSABS  --  1.0  --   --   --   MONOABS  --  0.3  --   --   --   EOSABS  --  0.2  --   --   --   BASOSABS  --  0.0  --   --   --     Chemistries   Recent Labs Lab 12/29/14 0500 12/30/14 0500  NA 146* 148*  K 3.8 3.6  CL 113* 114*  CO2 29 28  GLUCOSE 104* 105*  BUN 10 7  CREATININE 1.11 1.22  CALCIUM 8.1* 7.5*   ------------------------------------------------------------------------------------------------------------------ estimated creatinine clearance is 80.7 mL/min (by C-G formula based on Cr of 1.22). ------------------------------------------------------------------------------------------------------------------ No results for input(s): HGBA1C in the last 72 hours. ------------------------------------------------------------------------------------------------------------------  No results for input(s): CHOL, HDL, LDLCALC, TRIG, CHOLHDL, LDLDIRECT in the last 72  hours. ------------------------------------------------------------------------------------------------------------------ No results for input(s): TSH, T4TOTAL, T3FREE, THYROIDAB in the last 72 hours.  Invalid input(s): FREET3 ------------------------------------------------------------------------------------------------------------------ No results for input(s): VITAMINB12, FOLATE, FERRITIN, TIBC, IRON, RETICCTPCT in the last 72 hours.  Coagulation profile  Recent Labs Lab 12/29/14 2217  INR 1.31    No results for input(s): DDIMER in the last 72 hours.  Cardiac Enzymes No results for input(s): CKMB, TROPONINI, MYOGLOBIN in the last 168 hours.  Invalid input(s): CK ------------------------------------------------------------------------------------------------------------------ Invalid input(s): POCBNP  No results for input(s): GLUCAP in the last 72 hours.   Eusebio Blazejewski M.D. [facility] 01/04/2015, 10:23 AM  Pager: [phone]   Between 7am to 7pm - call Pager - [phone]  After 7pm go to [email or web address] - password [email or web address]  Call night coverage person covering after 7pm

## 2015-01-05 ENCOUNTER — Encounter (HOSPITAL_COMMUNITY): Payer: Self-pay | Admitting: Nurse Practitioner

## 2015-01-05 DIAGNOSIS — Z515 Encounter for palliative care: Secondary | ICD-10-CM | POA: Insufficient documentation

## 2015-01-05 MED ORDER — MIDAZOLAM BOLUS VIA INFUSION
5.0000 mg | INTRAVENOUS | Status: DC | PRN
Start: 2015-01-05 — End: 2015-01-07
  Administered 2015-01-05 – 2015-01-06 (×5): 5 mg via INTRAVENOUS
  Filled 2015-01-05 (×6): qty 5

## 2015-01-05 NOTE — Progress Notes (Signed)
Scheduled mouth care discontinued in order to decrease stimulation. Patient becomes severely agitated with stimulation. Will continue prn mouth care as patient tolerates.

## 2015-01-05 NOTE — Progress Notes (Signed)
4/13 Bedside assessment and update given by primary RN.  Antonio Tyler has required bolus dose Versed and Dilaudid today.  The last boluse was given at about 1215.  Since that time his agitation has controlled again on primary gtt rates of Versed at 10mg /hr and Dilaudid at 15mg /hr.  Primary RN report to [name] in previous note. With orders increased for bolus and primary   Nursing staff including sitter tech aware of full comfort measures and goals of care for Antonio Tyler. Limited physical stimulation including turning and lined change are to be kept at a minimum.  All goals are to support the process of dying with dignity, relief of suffering and management of symptoms for Antonio Tyler.  Nursing asked to notify me if family arrives today.  Will follow up at shift change with nursing staff.  Please page with any questions.  Cindra PresumeSue Ellen Lysette Lindenbaum, RN, MSN, [facility]

## 2015-01-05 NOTE — Progress Notes (Signed)
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  Name: Antonio Tyler MRN: [id number] Date of Birth: [birth year]  Today's Date: 12/30/2014 Time: SLP Start Time (ACUTE ONLY): 0930-SLP Stop Time (ACUTE ONLY): 1000 SLP Time Calculation (min) (ACUTE ONLY): 30 min  Past Medical History:  Past Medical History  Diagnosis Date  . Hypertension   . Seizures   . Anxiety   . Traumatic brain injury   . Alcohol abuse    Past Surgical History: No past surgical history on file. HPI:  HPI: Pt is a 54 y/o male with PMH of TBI, SAH, subdural hemorrhage,  cerebral contusion, HTN, alcohol abuse, tracheostomy status, PEG tube  status, multiple rib fractures, T1 fracture, enterococcal bacteremia, and  IVC  filter placement. Pt brought in from [facility] with chronic profuse  penile bleeding followng chronic foley change.   No Data Recorded  Assessment / Plan / Recommendation [facility] IP CLINICAL IMPRESSIONS 12/30/2014  Dysphagia Diagnosis Severe pharyngeal phase dysphagia;Mild oral phase  dysphagia  Clinical impression Pt presents with mild oral phase and severe pharyngeal  phase  dysphagia characterized by reduced bolus manipulation, base of  tongue weakness, reduced epiglottic inversion, reduced hyolarungeal  elevation and airway protection. Severity of impairments suspect caused by  reduced sensation, NG tube, weakened pharyngeal muscles due to disuse  atrophy, and cognitive deficits in combination with sedating medication.  Pt silently aspirated across all liquid consistencies. Pt unable to expel  aspirate and follow cues to cough. No aspiration noted with purees. Noted  increased vallecular residue across consistencies. Pt able to reduce  vallecular residue when given cue to swallow (dry spoon), however, pt  response inconsitent. Recommend continued trials of pureed consistencies  with speech therapy only. Hopeful pt will be able to progress following NG  tube removal and better management of sedating medication.       [facility] IP TREATMENT RECOMMENDATION 12/30/2014  Treatment Plan Recommendations Therapy as outlined in treatment plan below      [facility] IP DIET RECOMMENDATION 12/30/2014  Diet Recommendations Dysphagia 1 (Puree)  Liquid Administration via (None)  Medication Administration Via alternative means  Compensations (None)  Postural Changes and/or Swallow Maneuvers (None)     [facility] IP OTHER RECOMMENDATIONS 12/29/2014  Recommended Consults MBS  Oral Care Recommendations (None)  Other Recommendations (None)     [facility] IP FOLLOW UP RECOMMENDATIONS 12/30/2014  Follow up Recommendations Skilled Nursing facility     [facility] IP FREQUENCY AND DURATION 12/30/2014  Speech Therapy Frequency (ACUTE ONLY) min 2x/week  Treatment Duration 2 weeks     Pertinent Vitals/Pain NA    SLP Swallow Goals No flowsheet data found.  No flowsheet data found.    No flowsheet data found.   [facility] IP ORAL PHASE 12/30/2014  Lips (None)  Tongue (None)  Mucous membranes (None)  Nutritional status (None)  Other (None)  Oxygen therapy (None)  Oral Phase Impaired  Oral - Pudding Teaspoon (None)  Oral - Pudding Cup (None)  Oral - Honey Teaspoon  Lingual/palatal residue;Other (Comment)  Oral - Honey Cup Lingual/palatal residue;Other (Comment)  Oral - Honey Syringe (None)  Oral - Nectar Teaspoon Lingual/palatal residue;Other (Comment)  Oral - Nectar Cup Lingual/palatal residue;Other (Comment)  Oral - Nectar Straw Lingual/palatal residue;Other (Comment)  Oral - Nectar Syringe (None)  Oral - Ice Chips (None)  Oral - Thin Teaspoon (None)  Oral - Thin Cup Lingual/palatal residue;Other (Comment)  Oral - Thin Straw (None)  Oral - Thin Syringe (None)  Oral - Puree Lingual/palatal residue;Other (Comment)  Oral - Mechanical Soft (None)  Oral - Regular (None)  Oral - Multi-consistency (None)  Oral - Pill (None)  Oral Phase - Comment (None)      [facility] IP PHARYNGEAL PHASE 12/30/2014  Pharyngeal Phase Impaired  Pharyngeal - Pudding Teaspoon (None)  Penetration/Aspiration details (pudding teaspoon) (None)  Pharyngeal - Pudding Cup (None)  Penetration/Aspiration details (pudding cup) (None)  Pharyngeal - Honey Teaspoon Delayed swallow initiation;Premature spillage  to pyriform sinuses;Reduced epiglottic inversion;Pharyngeal residue -  valleculae;Reduced laryngeal elevation;Reduced airway/laryngeal  closure;Reduced tongue base retraction;Penetration/Aspiration during  swallow  Penetration/Aspiration details (honey teaspoon) Material enters airway,  passes BELOW cords and not ejected out despite cough attempt by patient  Pharyngeal - Honey Cup Delayed swallow initiation;Premature spillage to  pyriform  sinuses;Reduced epiglottic inversion;Pharyngeal residue -  valleculae;Reduced laryngeal elevation;Reduced airway/laryngeal  closure;Reduced tongue base retraction;Penetration/Aspiration during  swallow  Penetration/Aspiration details (honey cup) Material enters airway, passes  BELOW cords and not ejected out despite cough attempt by patient  Pharyngeal - Honey Syringe (None)  Penetration/Aspiration details (honey syringe) (None)  Pharyngeal - Nectar Teaspoon (None)   Penetration/Aspiration details (nectar teaspoon) (None)  Pharyngeal - Nectar Cup Delayed swallow initiation;Premature spillage to  pyriform sinuses;Reduced epiglottic inversion;Pharyngeal residue -  valleculae;Reduced laryngeal elevation;Reduced airway/laryngeal  closure;Reduced tongue base retraction;Penetration/Aspiration during  swallow;Penetration/Aspiration before swallow  Penetration/Aspiration details (nectar cup) Material enters airway, passes  BELOW cords and not ejected out despite cough attempt by patient  Pharyngeal - Nectar Straw Delayed swallow initiation;Premature spillage to  pyriform sinuses;Reduced epiglottic inversion;Pharyngeal residue -  valleculae;Reduced laryngeal elevation;Reduced airway/laryngeal  closure;Reduced tongue base retraction;Penetration/Aspiration before  swallow  Penetration/Aspiration details (nectar straw) Material enters airway,  passes BELOW cords and not ejected out despite cough attempt by patient  Pharyngeal - Nectar Syringe (None)  Penetration/Aspiration details (nectar syringe) (None)  Pharyngeal - Ice Chips (None)  Penetration/Aspiration details (ice chips) (None)  Pharyngeal - Thin Teaspoon (None)  Penetration/Aspiration details (thin teaspoon) (None)  Pharyngeal - Thin Cup Delayed swallow initiation;Premature spillage to  pyriform sinuses;Reduced epiglottic inversion;Reduced laryngeal  elevation;Reduced airway/laryngeal closure;Reduced tongue base  retraction;Penetration/Aspiration before swallow;Pharyngeal residue -  valleculae;Trace aspiration  Penetration/Aspiration details (thin cup) Material enters airway, passes  BELOW cords and not ejected out despite cough attempt by patient  Pharyngeal - Thin Straw (None)  Penetration/Aspiration details (thin straw) (None)  Pharyngeal - Thin Syringe (None)  Penetration/Aspiration details (thin syringe') (None)  Pharyngeal - Puree Delayed swallow initiation;Premature spillage to  pyriform sinuses;Reduced epiglottic  inversion;Reduced laryngeal  elevation;Reduced airway/laryngeal closure;Reduced tongue base  retraction;Pharyngeal residue - valleculae  Penetration/Aspiration details (puree) (None)  Pharyngeal - Mechanical Soft (None)  Penetration/Aspiration details (mechanical soft) (None)  Pharyngeal - Regular (None)  Penetration/Aspiration details (regular) (None)  Pharyngeal - Multi-consistency (None)  Penetration/Aspiration details (multi-consistency) (None)  Pharyngeal - Pill (None)  Penetration/Aspiration details (pill) (None)  Pharyngeal Comment (None)     No flowsheet data found.  No flowsheet data found.         DeBlois, Riley Nearing 12/30/2014, 1:26 PM     CBC  Recent Labs Lab 12/29/14 1810 12/29/14 2217 12/30/14 0500 12/30/14 1730  WBC 4.4 7.1 6.0  --   HGB 6.4* 8.3* 7.6* 7.2*  HCT 19.3* 24.3* 23.0* 21.8*  PLT 105* 111* 105*  --   MCV 91.5 90.3 89.8  --   MCH 30.3 30.9 29.7  --   MCHC 33.2 34.2 33.0  --   RDW 15.3 15.2 15.9*  --   LYMPHSABS 1.0  --   --   --   MONOABS 0.3  --   --   --   EOSABS 0.2  --   --   --   BASOSABS 0.0  --   --   --     Chemistries   Recent Labs Lab 12/30/14 0500  NA 148*  K 3.6  CL 114*  CO2 28  GLUCOSE 105*  BUN 7  CREATININE 1.22  CALCIUM 7.5*   ------------------------------------------------------------------------------------------------------------------ estimated creatinine clearance is 80.7 mL/min (by C-G formula based on Cr of 1.22). ------------------------------------------------------------------------------------------------------------------ No results for input(s): HGBA1C in the last 72 hours. ------------------------------------------------------------------------------------------------------------------ No results for input(s): CHOL, HDL, LDLCALC, TRIG, CHOLHDL, LDLDIRECT in the last 72 hours. ------------------------------------------------------------------------------------------------------------------ No results for  input(s): TSH, T4TOTAL, T3FREE, THYROIDAB in  the last 72 hours.  Invalid input(s): FREET3 ------------------------------------------------------------------------------------------------------------------ No results for input(s): VITAMINB12, FOLATE, FERRITIN, TIBC, IRON, RETICCTPCT in the last 72 hours.  Coagulation profile  Recent Labs Lab 12/29/14 2217  INR 1.31    No results for input(s): DDIMER in the last 72 hours.  Cardiac Enzymes No results for input(s): CKMB, TROPONINI, MYOGLOBIN in the last 168 hours.  Invalid input(s): CK ------------------------------------------------------------------------------------------------------------------ Invalid input(s): POCBNP  No results for input(s): GLUCAP in the last 72 hours.   Randol Kern, Lavilla Delamora M.D. [facility] 01/05/2015, 10:29 AM  Pager: [phone]  Between 7am to 7pm - call Pager - [phone]  After 7pm go to [email or web address] - password [email or web address]  Call night coverage person covering after 7pm

## 2015-01-05 NOTE — Progress Notes (Signed)
Patient extremetly agitated, combative with staff, attempting to throw legs over bed rails and get OOB.  Patient currently on comfort measures, on Versed gtt 10mg /hr, Dilaudid 15mg /hr, agitation refractory to multiple boluses, including Haldol 4mg  IV.  Sitter placed at bedside for patient safety.  Called and spoke with [name] regarding extremely difficult course to alleviate patient's agitation.  Changes to continuous infusion and bolus dose made per MD order.  Will continue to monitor.

## 2015-01-06 NOTE — Progress Notes (Signed)
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 Student  Supervised and reviewed by Harlon Ditty MA CCC-SLP  Patient Details  Name: Krystofer Hevener MRN: [id number] Date of Birth: [birth year]  Today's Date: 12/30/2014 Time: SLP Start Time (ACUTE ONLY): 0930-SLP Stop Time (ACUTE ONLY): 1000 SLP Time Calculation (min) (ACUTE ONLY): 30 min  Past Medical History:  Past Medical History  Diagnosis Date  . Hypertension   . Seizures   . Anxiety   . Traumatic brain injury   . Alcohol abuse    Past Surgical History: No past surgical history on file. HPI:  HPI: Pt is a 54 y/o male with PMH of TBI, SAH, subdural hemorrhage,  cerebral contusion, HTN, alcohol abuse, tracheostomy status, PEG tube  status, multiple rib fractures, T1 fracture, enterococcal bacteremia, and  IVC filter placement. Pt brought  in from [facility] with chronic profuse  penile bleeding followng chronic foley change.   No Data Recorded  Assessment / Plan / Recommendation [facility] IP CLINICAL IMPRESSIONS 12/30/2014  Dysphagia Diagnosis Severe pharyngeal phase dysphagia;Mild oral phase  dysphagia  Clinical impression Pt presents with mild oral  phase and severe pharyngeal  phase dysphagia characterized by reduced bolus manipulation, base of  tongue weakness, reduced epiglottic inversion, reduced hyolarungeal  elevation and airway protection. Severity of impairments suspect caused by  reduced sensation, NG tube, weakened pharyngeal muscles due to disuse  atrophy, and cognitive deficits in combination with sedating medication.  Pt silently aspirated across all liquid consistencies. Pt unable to expel  aspirate and follow cues to cough. No aspiration noted with purees. Noted  increased vallecular residue across consistencies. Pt able to reduce  vallecular residue when given cue to swallow (dry spoon), however, pt  response inconsitent. Recommend continued trials of pureed consistencies  with speech therapy only. Hopeful pt will be able to progress following NG  tube removal and better management of sedating medication.       [facility] IP TREATMENT RECOMMENDATION 12/30/2014  Treatment Plan Recommendations Therapy as outlined in treatment plan below      [facility] IP DIET RECOMMENDATION 12/30/2014  Diet Recommendations Dysphagia 1 (Puree)  Liquid Administration via (None)  Medication Administration Via alternative means  Compensations (None)  Postural Changes and/or Swallow Maneuvers (None)     [facility] IP OTHER RECOMMENDATIONS 12/29/2014  Recommended Consults MBS  Oral Care Recommendations (None)  Other Recommendations (None)     [facility] IP FOLLOW UP RECOMMENDATIONS 12/30/2014  Follow up Recommendations Skilled Nursing facility     [facility] IP FREQUENCY AND DURATION 12/30/2014  Speech Therapy Frequency (ACUTE ONLY) min 2x/week  Treatment Duration 2 weeks     Pertinent Vitals/Pain NA    SLP Swallow Goals No flowsheet data found.  No flowsheet data found.    No flowsheet data found.   [facility] IP ORAL PHASE 12/30/2014  Lips (None)  Tongue (None)  Mucous membranes (None)  Nutritional status (None)  Other (None)  Oxygen therapy (None)  Oral Phase Impaired  Oral - Pudding Teaspoon (None)  Oral - Pudding Cup  (None)  Oral - Honey Teaspoon Lingual/palatal residue;Other (Comment)  Oral - Honey Cup Lingual/palatal residue;Other (Comment)  Oral - Honey Syringe (None)  Oral - Nectar Teaspoon Lingual/palatal residue;Other (Comment)  Oral - Nectar Cup Lingual/palatal residue;Other (Comment)  Oral - Nectar Straw Lingual/palatal residue;Other (Comment)  Oral - Nectar Syringe (None)  Oral - Ice Chips (None)  Oral - Thin Teaspoon (None)  Oral - Thin Cup Lingual/palatal residue;Other (Comment)  Oral - Thin Straw (None)  Oral - Thin Syringe (None)  Oral - Puree Lingual/palatal residue;Other (Comment)  Oral - Mechanical Soft (None)  Oral - Regular (None)  Oral - Multi-consistency (None)  Oral - Pill (None)  Oral Phase - Comment (None)      [facility] IP PHARYNGEAL PHASE 12/30/2014  Pharyngeal Phase Impaired  Pharyngeal - Pudding Teaspoon (None)  Penetration/Aspiration details (pudding teaspoon) (None)  Pharyngeal - Pudding Cup (None)  Penetration/Aspiration details (pudding cup) (None)  Pharyngeal - Honey Teaspoon Delayed swallow initiation;Premature spillage  to pyriform sinuses;Reduced epiglottic inversion;Pharyngeal residue -  valleculae;Reduced laryngeal elevation;Reduced airway/laryngeal  closure;Reduced tongue base retraction;Penetration/Aspiration during  swallow  Penetration/Aspiration details (honey teaspoon) Material enters airway,  passes BELOW cords and not ejected out despite cough attempt by patient  Pharyngeal - Honey Cup Delayed swallow initiation;Premature spillage to  pyriform sinuses;Reduced epiglottic inversion;Pharyngeal residue -  valleculae;Reduced laryngeal elevation;Reduced airway/laryngeal  closure;Reduced tongue base retraction;Penetration/Aspiration during  swallow  Penetration/Aspiration details (honey cup) Material enters airway, passes  BELOW cords and not ejected out despite cough attempt by patient  Pharyngeal - Honey Syringe (None)  Penetration/Aspiration details (honey syringe) (None)  Pharyngeal - Nectar  Teaspoon (None)  Penetration/Aspiration details (nectar teaspoon) (None)  Pharyngeal - Nectar Cup Delayed swallow initiation;Premature spillage to  pyriform sinuses;Reduced epiglottic inversion;Pharyngeal residue -  valleculae;Reduced laryngeal elevation;Reduced airway/laryngeal  closure;Reduced tongue base retraction;Penetration/Aspiration during  swallow;Penetration/Aspiration before swallow  Penetration/Aspiration details (nectar cup) Material enters airway, passes  BELOW cords and not ejected out despite cough attempt by patient  Pharyngeal - Nectar Straw Delayed swallow initiation;Premature spillage to  pyriform sinuses;Reduced epiglottic inversion;Pharyngeal residue -  valleculae;Reduced laryngeal elevation;Reduced airway/laryngeal  closure;Reduced tongue base retraction;Penetration/Aspiration before  swallow  Penetration/Aspiration details (nectar straw) Material enters airway,  passes BELOW cords and not ejected out despite cough attempt by patient  Pharyngeal - Nectar Syringe (None)  Penetration/Aspiration details (nectar syringe) (None)  Pharyngeal - Ice Chips (None)  Penetration/Aspiration details (ice chips) (None)  Pharyngeal - Thin Teaspoon (None)  Penetration/Aspiration details (thin teaspoon) (None)  Pharyngeal - Thin Cup Delayed swallow initiation;Premature spillage to  pyriform sinuses;Reduced epiglottic inversion;Reduced laryngeal  elevation;Reduced airway/laryngeal closure;Reduced tongue base  retraction;Penetration/Aspiration before swallow;Pharyngeal residue -  valleculae;Trace aspiration  Penetration/Aspiration details (thin cup) Material enters airway, passes  BELOW cords and not ejected out despite cough attempt by patient  Pharyngeal - Thin Straw (None)  Penetration/Aspiration details (thin straw) (None)  Pharyngeal - Thin Syringe (None)  Penetration/Aspiration details (thin syringe') (None)  Pharyngeal - Puree Delayed swallow initiation;Premature spillage to  pyriform sinuses;Reduced  epiglottic inversion;Reduced laryngeal  elevation;Reduced airway/laryngeal closure;Reduced tongue base  retraction;Pharyngeal residue - valleculae  Penetration/Aspiration details (puree) (None)  Pharyngeal - Mechanical Soft (None)  Penetration/Aspiration details (mechanical soft) (None)  Pharyngeal - Regular (None)  Penetration/Aspiration details (regular) (None)  Pharyngeal - Multi-consistency (None)  Penetration/Aspiration details (multi-consistency) (None)  Pharyngeal - Pill (None)  Penetration/Aspiration details (pill) (None)  Pharyngeal Comment (None)     No flowsheet data found.  No flowsheet data found.         DeBlois, Riley Nearing 12/30/2014, 1:26 PM     CBC  Recent Labs Lab 12/30/14 1730  HGB 7.2*  HCT 21.8*    Chemistries  No results for input(s): NA, K, CL, CO2, GLUCOSE, BUN, CREATININE, CALCIUM, MG, AST, ALT, ALKPHOS, BILITOT in the last 168 hours.  Invalid input(s): GFRCGP ------------------------------------------------------------------------------------------------------------------ estimated creatinine clearance is 80.7 mL/min (by C-G formula based on Cr of 1.22). ------------------------------------------------------------------------------------------------------------------ No results for input(s): HGBA1C in the last 72 hours. ------------------------------------------------------------------------------------------------------------------ No results for input(s): CHOL, HDL, LDLCALC, TRIG, CHOLHDL, LDLDIRECT in the last 72 hours. ------------------------------------------------------------------------------------------------------------------ No results for input(s): TSH, T4TOTAL, T3FREE, THYROIDAB in the last 72 hours.  Invalid input(s): FREET3 ------------------------------------------------------------------------------------------------------------------ No results for input(s): VITAMINB12, FOLATE, FERRITIN, TIBC, IRON, RETICCTPCT in the last 72  hours.  Coagulation profile No results for input(s): INR, PROTIME in the last 168 hours.  No results for input(s): DDIMER in the last 72 hours.  Cardiac Enzymes No results for input(s): CKMB, TROPONINI, MYOGLOBIN in the last 168 hours.  Invalid input(s): CK ------------------------------------------------------------------------------------------------------------------ Invalid input(s): POCBNP  No results for input(s): GLUCAP in the last 72 hours.   Randol Kern, Gwenette Wellons M.D. [facility] 01/06/2015, 12:54 PM  Pager: [phone]  Between 7am to 7pm - call Pager - [phone]  After 7pm go to [email or web address] - password [email or web address]  Call night coverage person  covering after 7pm

## 2015-01-07 DIAGNOSIS — R579 Shock, unspecified: Secondary | ICD-10-CM

## 2015-01-07 MED ORDER — HYDROMORPHONE BOLUS VIA INFUSION
5.0000 mg | INTRAVENOUS | Status: DC | PRN
  Administered 2015-01-08 (×4): 5 mg via INTRAVENOUS
  Filled 2015-01-07 (×5): qty 5

## 2015-01-07 MED ORDER — SODIUM CHLORIDE 0.9 % IV SOLN
1.0000 mg/h | INTRAVENOUS | Status: DC
  Administered 2015-01-07 – 2015-01-08 (×5): 20 mg/h via INTRAVENOUS
  Filled 2015-01-07 (×5): qty 20

## 2015-01-07 MED ORDER — MIDAZOLAM BOLUS VIA INFUSION
8.0000 mg | INTRAVENOUS | Status: DC | PRN
  Administered 2015-01-08 (×7): 8 mg via INTRAVENOUS
  Filled 2015-01-07 (×8): qty 8
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
## 2015-01-07 NOTE — Progress Notes (Signed)
CSW continuing to follow.   Per chart review,pt remains on palliative sedation for terminal care. Versed and Dilaudid to control agitation/suffering at EOL.CSW spoke with PMT MD, [name] who has been in contact with family members via phone conversations. Possibility of inpatient hospice transfer for continuation of EOL services. MD note reflects that pt family plan to come to town tomorrow. CSW will follow up at that time in order to assist with pt disposition plan.   CSW to continue to follow and complete full psychosocial assessment when pt family arrives.  Antonio SpecterSuzanna Areeb Tyler, MSW, LCSW Clinical Social Work [phone]

## 2015-01-07 NOTE — Progress Notes (Signed)
Bedside visit in ICU and plan of care discussed with primary RN.  Mr. Antonio Tyler remains on palliative sedation for terminal care.  Versed and Dilaudid to control agitation/suffering at EOL.  Per nursing Mr. Antonio Tyler's symptoms have been controlled today, has not required any additional bolus doses for symptom control.  Plan of care continues to be palliative symptom management and support through EOL.  Nursing care and plan is supportive of these measures.  Titration orders in place for escalated symptoms.  Dr. Phillips Tyler updated on patient condition.  She has been in contact with family members via phone conversations.  Possibility of inpatient hospice transfer for continuation of EOL services.  Will continue to follow. Please contact palliative care for any issues.  Cindra PresumeSue Ellen Sequoyah Counterman, RN, MSN, [facility] Palliative Care
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
## 2015-01-08 DIAGNOSIS — R579 Shock, unspecified: Secondary | ICD-10-CM

## 2015-01-08 MED ORDER — PROPOFOL 1000 MG/100ML IV EMUL
4.0000 ug/kg/min | INTRAVENOUS | Status: DC
  Administered 2015-01-08: 4 ug/kg/min via INTRAVENOUS
  Administered 2015-01-09: 34.542 ug/kg/min via INTRAVENOUS
  Administered 2015-01-09: 30 ug/kg/min via INTRAVENOUS
  Administered 2015-01-09: 40 ug/kg/min via INTRAVENOUS
  Administered 2015-01-09: 28.497 ug/kg/min via INTRAVENOUS
  Administered 2015-01-10: 40 ug/kg/min via INTRAVENOUS

## 2015-01-08 MED ORDER — FENTANYL BOLUS VIA INFUSION
100.0000 ug | INTRAVENOUS | Status: DC | PRN
  Administered 2015-01-09 – 2015-01-10 (×12): 100 ug via INTRAVENOUS
  Filled 2015-01-08: qty 100

## 2015-01-08 MED ORDER — FENTANYL CITRATE (PF) 2500 MCG/50ML IJ SOLN
100.0000 ug/h | Status: DC
  Administered 2015-01-08: 100 ug/h via INTRAVENOUS
  Administered 2015-01-09: 300 ug/h via INTRAVENOUS
  Administered 2015-01-10: 350 ug/h via INTRAVENOUS
  Filled 2015-01-08 (×4): qty 100

## 2015-01-08 MED ORDER — PROPOFOL BOLUS VIA INFUSION
0.2500 mg/kg | INTRAVENOUS | Status: DC | PRN
  Administered 2015-01-09: 12.5 mg via INTRAVENOUS
  Administered 2015-01-09 (×4): 25 mg via INTRAVENOUS
  Administered 2015-01-09 (×4): 12.5 mg via INTRAVENOUS
  Administered 2015-01-09 – 2015-01-10 (×5): 25 mg via INTRAVENOUS
  Administered 2015-01-10: 24.13 mg via INTRAVENOUS
  Administered 2015-01-10 (×2): 25 mg via INTRAVENOUS
  Filled 2015-01-08 (×18): qty 49

## 2015-01-08 NOTE — Consult Note (Addendum)
[facility] Liaison: Continue to follow for [facility]. Was made aware of plan for family meeting today by CSW and [name]. Will be available this morning to continue [facility] discussion and support family in person if interest [facility]. Spoke with Antonio Tyler and brother Antonio RuizJohn by phone 01/06/2015. They are clearly struggling and conflicted with decisions. Thank you. Forrestine Himva Nakya Weyand LCSW [phone]

## 2015-01-08 NOTE — Progress Notes (Addendum)
RN was able to make contact with [name] who is on her way in to see patient and address sedation issues and patient safety concern.

## 2015-01-08 NOTE — Progress Notes (Addendum)
Patient with extreme episode of agitation starting at approximately 1600. Prn boluses given. Bladder scanned with no urine seen on scan. Dr. Randol KernElgergawy in to see patient and instructs to also page palliative MD. On call palliative MD, Dr. Greig RightLampkin called and updated on situation. Dr. Greig RightLampkin saw patient instructed RN to call [name] who has been following the patient closely. Both Dr. Randol KernElgergawy and Dr. Greig RightLampkin were able to witness a periods of the paitient's agitation. Unable to reach [name] at this time. RN left message and call back number.

## 2015-01-08 NOTE — Progress Notes (Signed)
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    Objective Swallowing Evaluation:   Leonia Corona, SLP Student  Supervised and reviewed by Harlon Ditty MA CCC-SLP  Patient Details  Name: Imri Lor MRN: [id number] Date of Birth: 23-Jun-1961  Today's Date: 12/30/2014 Time: SLP Start Time (ACUTE ONLY): 0930-SLP Stop Time (ACUTE ONLY): 1000 SLP Time Calculation (min) (ACUTE ONLY): 30 min  Past Medical History:  Past Medical History  Diagnosis Date  . Hypertension   . Seizures   . Anxiety   . Traumatic brain injury   . Alcohol abuse    Past Surgical History: Antonio past surgical history on Tyler. HPI:  HPI: Pt is a 54 y/o male with PMH of TBI, SAH, subdural hemorrhage,  cerebral contusion, HTN, alcohol abuse, tracheostomy status, PEG tube  status, multiple rib fractures, T1 fracture, enterococcal bacteremia, and  IVC filter placement. Pt brought in from [facility] with chronic profuse  penile bleeding followng chronic foley change.   Antonio Data  Recorded  Assessment / Plan / Recommendation CHL IP CLINICAL IMPRESSIONS 12/30/2014  Dysphagia Diagnosis Severe pharyngeal phase dysphagia;Mild oral phase  dysphagia  Clinical impression Pt presents with mild oral phase and severe pharyngeal  phase dysphagia characterized by reduced bolus  manipulation, base of  tongue weakness, reduced epiglottic inversion, reduced hyolarungeal  elevation and airway protection. Severity of impairments suspect caused by  reduced sensation, NG tube, weakened pharyngeal muscles due to disuse  atrophy, and cognitive deficits in combination with sedating medication.  Pt silently aspirated across all liquid consistencies. Pt unable to expel  aspirate and follow cues to cough. Antonio aspiration noted with purees. Noted  increased vallecular residue across consistencies. Pt able to reduce  vallecular residue when given cue to swallow (dry spoon), however, pt  response inconsitent. Recommend continued trials of pureed consistencies  with speech therapy only. Hopeful pt will be able to progress following NG  tube removal and better management of sedating medication.       CHL IP TREATMENT RECOMMENDATION 12/30/2014  Treatment Plan Recommendations Therapy as outlined in treatment plan below      CHL IP DIET RECOMMENDATION 12/30/2014  Diet Recommendations Dysphagia 1 (Puree)  Liquid Administration via (None)  Medication Administration Via alternative means  Compensations (None)  Postural Changes and/or Swallow Maneuvers (None)     CHL IP OTHER RECOMMENDATIONS 12/29/2014  Recommended Consults MBS  Oral Care Recommendations (None)  Other Recommendations (None)     CHL IP FOLLOW UP RECOMMENDATIONS 12/30/2014  Follow up Recommendations Skilled Nursing facility     [facility] IP FREQUENCY AND DURATION 12/30/2014  Speech Therapy Frequency (ACUTE ONLY) min 2x/week  Treatment Duration 2 weeks     Pertinent Vitals/Pain NA    SLP Swallow Goals Antonio flowsheet data found.  Antonio flowsheet data found.    Antonio flowsheet data found.   CHL IP ORAL PHASE 12/30/2014  Lips (None)  Tongue (None)  Mucous membranes (None)  Nutritional status (None)  Other (None)  Oxygen therapy (None)  Oral Phase Impaired  Oral - Pudding Teaspoon (None)  Oral - Pudding Cup (None)  Oral - Honey Teaspoon Lingual/palatal residue;Other (Comment)  Oral  - Honey Cup Lingual/palatal residue;Other (Comment)  Oral - Honey Syringe (None)  Oral - Nectar Teaspoon Lingual/palatal residue;Other (Comment)  Oral - Nectar Cup Lingual/palatal residue;Other (Comment)  Oral - Nectar Straw Lingual/palatal residue;Other (Comment)  Oral - Nectar Syringe (None)  Oral - Ice Chips (None)  Oral - Thin Teaspoon (None)  Oral - Thin Cup Lingual/palatal residue;Other (Comment)  Oral - Thin Straw (None)  Oral - Thin Syringe (None)  Oral - Puree Lingual/palatal residue;Other (Comment)  Oral - Mechanical Soft (None)  Oral - Regular (None)  Oral - Multi-consistency (None)  Oral - Pill (None)  Oral Phase - Comment (None)      CHL IP PHARYNGEAL PHASE 12/30/2014  Pharyngeal Phase Impaired  Pharyngeal - Pudding Teaspoon (None)  Penetration/Aspiration details (pudding teaspoon) (None)  Pharyngeal - Pudding Cup (None)  Penetration/Aspiration details (pudding cup) (None)  Pharyngeal - Honey Teaspoon Delayed swallow initiation;Premature spillage  to pyriform sinuses;Reduced epiglottic inversion;Pharyngeal residue -  valleculae;Reduced laryngeal elevation;Reduced airway/laryngeal  closure;Reduced tongue base retraction;Penetration/Aspiration during  swallow  Penetration/Aspiration details (honey teaspoon) Material enters airway,  passes BELOW cords and not ejected out despite cough attempt by patient  Pharyngeal - Honey Cup Delayed swallow initiation;Premature spillage to  pyriform sinuses;Reduced epiglottic inversion;Pharyngeal residue -  valleculae;Reduced laryngeal elevation;Reduced airway/laryngeal  closure;Reduced tongue base retraction;Penetration/Aspiration during  swallow  Penetration/Aspiration details (  honey cup) Material enters airway, passes  BELOW cords and not ejected out despite cough attempt by patient  Pharyngeal - Honey Syringe (None)  Penetration/Aspiration details (honey syringe) (None)  Pharyngeal - Nectar Teaspoon (None)  Penetration/Aspiration details (nectar teaspoon) (None)   Pharyngeal - Nectar Cup Delayed swallow initiation;Premature spillage to  pyriform sinuses;Reduced epiglottic inversion;Pharyngeal residue -  valleculae;Reduced laryngeal elevation;Reduced airway/laryngeal  closure;Reduced tongue base retraction;Penetration/Aspiration during  swallow;Penetration/Aspiration before swallow  Penetration/Aspiration details (nectar cup) Material enters airway, passes  BELOW cords and not ejected out despite cough attempt by patient  Pharyngeal - Nectar Straw Delayed swallow initiation;Premature spillage to  pyriform sinuses;Reduced epiglottic inversion;Pharyngeal residue -  valleculae;Reduced laryngeal elevation;Reduced airway/laryngeal  closure;Reduced tongue base retraction;Penetration/Aspiration before  swallow  Penetration/Aspiration details (nectar straw) Material enters airway,  passes BELOW cords and not ejected out despite cough attempt by patient  Pharyngeal - Nectar Syringe (None)  Penetration/Aspiration details (nectar syringe) (None)  Pharyngeal - Ice Chips (None)  Penetration/Aspiration details (ice chips) (None)  Pharyngeal - Thin Teaspoon (None)  Penetration/Aspiration details (thin teaspoon) (None)  Pharyngeal - Thin Cup Delayed swallow initiation;Premature spillage to  pyriform sinuses;Reduced epiglottic inversion;Reduced laryngeal  elevation;Reduced airway/laryngeal closure;Reduced tongue base  retraction;Penetration/Aspiration before swallow;Pharyngeal residue -  valleculae;Trace aspiration  Penetration/Aspiration details (thin cup) Material enters airway, passes  BELOW cords and not ejected out despite cough attempt by patient  Pharyngeal - Thin Straw (None)  Penetration/Aspiration details (thin straw) (None)  Pharyngeal - Thin Syringe (None)  Penetration/Aspiration details (thin syringe') (None)  Pharyngeal - Puree Delayed swallow initiation;Premature spillage to  pyriform sinuses;Reduced epiglottic inversion;Reduced laryngeal  elevation;Reduced airway/laryngeal  closure;Reduced tongue base  retraction;Pharyngeal residue - valleculae  Penetration/Aspiration details (puree) (None)  Pharyngeal - Mechanical Soft (None)  Penetration/Aspiration details (mechanical soft) (None)  Pharyngeal - Regular (None)  Penetration/Aspiration details (regular) (None)  Pharyngeal - Multi-consistency (None)  Penetration/Aspiration details (multi-consistency) (None)  Pharyngeal - Pill (None)  Penetration/Aspiration details (pill) (None)  Pharyngeal Comment (None)     Antonio flowsheet data found.  Antonio flowsheet data found.         DeBlois, Riley Nearing 12/30/2014, 1:26 PM     CBC Antonio results for input(s): WBC, HGB, HCT, PLT, MCV, MCH, MCHC, RDW, LYMPHSABS, MONOABS, EOSABS, BASOSABS, BANDABS in the last 168 hours.  Invalid input(s): NEUTRABS, BANDSABD  Chemistries  Antonio results for input(s): NA, K, CL, CO2, GLUCOSE, BUN, CREATININE, CALCIUM, MG, AST, ALT, ALKPHOS, BILITOT in the last 168 hours.  Invalid input(s): GFRCGP ------------------------------------------------------------------------------------------------------------------ estimated creatinine clearance is 80.7 mL/min (by C-G formula based on Cr of 1.22). ------------------------------------------------------------------------------------------------------------------ Antonio results for input(s): HGBA1C in the last 72 hours. ------------------------------------------------------------------------------------------------------------------ Antonio results for input(s): CHOL, HDL, LDLCALC, TRIG, CHOLHDL, LDLDIRECT in the last 72 hours. ------------------------------------------------------------------------------------------------------------------ Antonio results for input(s): TSH, T4TOTAL, T3FREE, THYROIDAB in the last 72 hours.  Invalid input(s): FREET3 ------------------------------------------------------------------------------------------------------------------ Antonio results for input(s): VITAMINB12, FOLATE, FERRITIN, TIBC, IRON,  RETICCTPCT in the last 72 hours.  Coagulation profile Antonio results for input(s): INR, PROTIME in the last 168 hours.  Antonio results for input(s): DDIMER in the last 72 hours.  Cardiac Enzymes Antonio results for input(s): CKMB, TROPONINI, MYOGLOBIN in the last 168 hours.  Invalid input(s): CK ------------------------------------------------------------------------------------------------------------------ Invalid input(s): POCBNP  Antonio results for input(s): GLUCAP in the last 72 hours.   Randol Kern, Vikram Tillett M.D. [facility] 01/08/2015, 12:51 PM  Pager: [phone]  Between 7am to 7pm - call Pager - [phone]  After 7pm go to [email or web address] - password [email or web address]  Call night coverage person covering after  7pm

## 2015-01-08 NOTE — Progress Notes (Signed)
Clinical Social Work Department BRIEF PSYCHOSOCIAL ASSESSMENT 01/08/2015  Patient:  Mosie EpsteinVICK,Jeston     Account Number:  [id number]     Admit date:  12/24/2014  Clinical Social Worker:  Garlan FairKIDD,Dearies Meikle, LCSWA  Date/Time:  01/08/2015 04:45 PM  Referred by:  Physician  Date Referred:  01/08/2015 Referred for  Residential hospice placement   Other Referral:   Interview type:  Other - See comment Other interview type:   discussion with [facility] liaison, Forrestine HimEva Davis    PSYCHOSOCIAL DATA Living Status:  FACILITY Admitted from facility:   Level of care:  Long Term Acute Primary support name:  John Marlin/brother/[phone] Primary support relationship to patient:  SIBLING Degree of support available:   decreased support, but pt brother arrived to hospital today to make decisions as previously there were some barriers to pt family arriving to bedside    CURRENT CONCERNS Current Concerns  Post-Acute Placement   Other Concerns:    SOCIAL WORK ASSESSMENT / PLAN CSW following for residential hospice placement. Multiple barriers have been present in planning for residential hospice as pt family is from out of town and pt brother was out of state for work and when arrived back into state pt brother could not bear to see him in this condition and was exhausted from [place] from [place].    CS[facility] Forrestine Himva Davis that pt brother, [name] and pt aunt, Windell MouldingRuth came to hospital earlier today for a short amount of time in order to confirm goals of care with MD. Per MD note and discussion with [facility], Forrestine HimEva Davis. [facility]. [facility] liaison, Forrestine Himva Davis was able to be a support to pt family during their visit to hospital today and was able to get admission paperwork signed [facility]. MD aware and [facility]    [facility] liaison will update CSW about bed availability.    Pt family left hospital before CSW able to meet with pt family.    [facility].   Assessment/plan status:  Psychosocial Support/Ongoing Assessment of Needs Other assessment/ plan:   discharge planning   Information/referral to community resources:   [facility] had been following pt from [facility]    PATIENT'S/FAMILY'S RESPONSE TO PLAN OF CARE: Pt remains on versed and dilaudid drip with primary focus being comfort. After many barriers, pt family was able to come to hospital today to meet with MD and confirm plans. Per chart review, pt brother has been very emotional surrounding pt poor prognosis and initially did not want to visit hospital because he did not want to see pt in this condition. CSW unable to thoroughly assess pt family's feelings as their visit from out of town was brief and pt family had left hospital before CSW able to assess.   Loletta SpecterSuzanna Shayde Gervacio, MSW, LCSW Clinical Social Work Weekend coverage [phone]

## 2015-01-08 NOTE — Progress Notes (Signed)
Palliative Medicine Team Note  Called received patient worsening agitation by ICU RN. I evaluated patient and initially he was calm but then had an episode of thrashing and raising out of the bed- this was not like typical seizure activity but much more voluntary and directed and clearly extremely distressing for him. He has maxed out almost all of his bolus doses and infusion rates. His symptoms are extremely refractory to standard infusion rates and interventions for palliative sedation. We have evaluated him for all reversible causes of his peaks in agitation-suctioned, flushed foley and checked for retention. At this point we will need to transition to a second line for palliative sedation.   Start Profol IV Continuous IV infusion (CIVI) at 0.25 mg per kg per hour to a maximum of 4 mg per kg per hour (4 mcg/kg/min to 67 mcg/kg/min) for agitated delirium. Adjust CIVI by 0.25 to 0.5 mg/kg/hr q 30 to 60 minutes for uncontrolled symptoms.   Discontinue versed infusion.   Discontinue Dilaudid and transition him to a fentanyl infusion for pain- While I do not believe his worsening agitation is from Dilaudid/Opiate toxicity I also cannot completely rule this out at this point. Pain is still registered within the central nervous system, even if the patient is not consciously aware of, or able to indicate it. Thus, we will need to continue at least some reasonable low level opiate. Signs of pain in a sedated patient may include, tearing, moaning, tachycardia, tachypnea, hypertension, and movement- will need to carefully monitor.  Fentanyl 13200mcg/hr titrate to 43400mcg/hr for NV signs of pain. Bolus doses ordered for breakthrough.  Please call [name] [phone] for issues with symptoms.  Anderson MaltaElizabeth Golding, DO Palliative Medicine

## 2015-01-08 NOTE — Progress Notes (Signed)
30ml of 1mg /ml of Versed wasted in sink. Witness by Iran OuchBecky Davis, RN

## 2015-01-08 NOTE — Progress Notes (Signed)
Pt's brother and aunt were bedside when I arrived talking w/pt physician who was explaining hospice care to family. Pt's brother became tearful and wanted prayer by pt's bedside. Chaplain offered prayer for pt and prayer of comfort for family. [place] family was very Adult nurseappreciative of prayer and presence. Marjory Liesamela Carrington Holder Chaplain   01/08/15 1200  Clinical Encounter Type  Visited With Family

## 2015-01-09 NOTE — Progress Notes (Signed)
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   Dg Swallowing Func-speech Pathology  12/30/2014    Objective Swallowing Evaluation:   Leonia Corona, SLP Student  Supervised and reviewed by Harlon Ditty MA CCC-SLP  Patient Details  Name: Baptiste Littler MRN: [id number] Date of Birth: June 13, 1961  Today's Date: 12/30/2014 Time: SLP Start Time (ACUTE ONLY): 0930-SLP Stop Time (ACUTE ONLY): 1000 SLP Time Calculation (min) (ACUTE ONLY): 30 min  Past Medical History:  Past Medical History  Diagnosis Date  . Hypertension   . Seizures   . Anxiety   . Traumatic brain injury   . Alcohol abuse    Past Surgical History: No past surgical history on file. HPI:  HPI: Pt is a 54 y/o male with PMH of TBI, SAH, subdural hemorrhage,  cerebral contusion, HTN, alcohol abuse, tracheostomy status, PEG tube  status, multiple rib fractures, T1 fracture, enterococcal bacteremia,  and  IVC filter placement. [facility] from [facility] with chronic profuse  penile bleeding followng chronic foley change.   No Data Recorded  Assessment / Plan / Recommendation CHL IP CLINICAL IMPRESSIONS 12/30/2014  Dysphagia Diagnosis Severe  pharyngeal phase dysphagia;Mild oral phase  dysphagia  Clinical impression Pt presents with mild oral phase and severe pharyngeal  phase dysphagia characterized by reduced bolus manipulation, base of  tongue weakness, reduced epiglottic inversion, reduced hyolarungeal  elevation and airway protection. Severity of impairments suspect caused by  reduced sensation, NG tube, weakened pharyngeal muscles due to disuse  atrophy, and cognitive deficits in combination with sedating medication.  Pt silently aspirated across all liquid consistencies. Pt unable to expel  aspirate and follow cues to cough. No aspiration noted with purees. Noted  increased vallecular residue across consistencies. Pt able to reduce  vallecular residue when given cue to swallow (dry spoon), however, pt  response inconsitent. Recommend continued trials of pureed consistencies  with speech therapy only. Hopeful pt will be able to progress following NG  tube removal and better management of sedating medication.       CHL IP TREATMENT RECOMMENDATION 12/30/2014  Treatment Plan Recommendations Therapy as outlined in treatment plan below      [facility] IP DIET RECOMMENDATION 12/30/2014  Diet Recommendations Dysphagia 1 (Puree)  Liquid Administration via (None)  Medication Administration Via alternative means  Compensations (None)  Postural Changes and/or Swallow Maneuvers (None)     CHL IP OTHER RECOMMENDATIONS 12/29/2014  Recommended Consults MBS  Oral Care Recommendations (None)  Other Recommendations (None)     CH[facility] IP FOLLOW UP RECOMMENDATIONS 12/30/2014  Follow up Recommendations Skilled Nursing facility     [facility] IP FREQUENCY AND DURATION 12/30/2014  Speech Therapy Frequency (ACUTE ONLY) min 2x/week  Treatment Duration 2 weeks     Pertinent Vitals/Pain NA    SLP Swallow Goals No flowsheet data found.  No flowsheet data found.    No flowsheet data found.   CHL IP ORAL PHASE 12/30/2014  Lips (None)  Tongue (None)  Mucous membranes (None)  Nutritional status (None)  Other  (None)  Oxygen therapy (None)  Oral Phase Impaired  Oral - Pudding Teaspoon (None)  Oral - Pudding Cup (None)  Oral - Honey Teaspoon Lingual/palatal residue;Other (Comment)  Oral - Honey Cup Lingual/palatal residue;Other (Comment)  Oral - Honey Syringe (None)  Oral - Nectar Teaspoon Lingual/palatal residue;Other (Comment)  Oral - Nectar Cup Lingual/palatal residue;Other (Comment)  Oral - Nectar Straw Lingual/palatal residue;Other (Comment)  Oral - Nectar Syringe (None)  Oral - Ice Chips (None)  Oral - Thin Teaspoon (None)  Oral - Thin Cup Lingual/palatal residue;Other (Comment)  Oral - Thin Straw (None)  Oral - Thin Syringe (None)  Oral - Puree Lingual/palatal residue;Other (Comment)  Oral - Mechanical Soft (None)  Oral - Regular (None)  Oral - Multi-consistency (None)  Oral - Pill (None)  Oral Phase - Comment (None)      CHL IP PHARYNGEAL PHASE 12/30/2014  Pharyngeal Phase Impaired  Pharyngeal - Pudding Teaspoon (None)  Penetration/Aspiration details (pudding teaspoon) (None)  Pharyngeal - Pudding Cup (None)  Penetration/Aspiration details (pudding cup) (None)  Pharyngeal - Honey Teaspoon Delayed swallow initiation;Premature spillage  to pyriform sinuses;Reduced epiglottic inversion;Pharyngeal residue -  valleculae;Reduced laryngeal elevation;Reduced airway/laryngeal  closure;Reduced tongue base retraction;Penetration/Aspiration during  swallow  Penetration/Aspiration details (honey teaspoon) Material enters airway,  passes BELOW cords and not ejected out despite cough attempt by patient  Pharyngeal - Honey Cup Delayed swallow initiation;Premature spillage  to  pyriform sinuses;Reduced epiglottic inversion;Pharyngeal residue -  valleculae;Reduced laryngeal elevation;Reduced airway/laryngeal  closure;Reduced tongue base retraction;Penetration/Aspiration during  swallow  Penetration/Aspiration details (honey cup) Material enters airway, passes  BELOW cords and not ejected out despite cough attempt by patient  Pharyngeal  - Honey Syringe (None)  Penetration/Aspiration details (honey syringe) (None)  Pharyngeal - Nectar Teaspoon (None)  Penetration/Aspiration details (nectar teaspoon) (None)  Pharyngeal - Nectar Cup Delayed swallow initiation;Premature spillage to  pyriform sinuses;Reduced epiglottic inversion;Pharyngeal residue -  valleculae;Reduced laryngeal elevation;Reduced airway/laryngeal  closure;Reduced tongue base retraction;Penetration/Aspiration during  swallow;Penetration/Aspiration before swallow  Penetration/Aspiration details (nectar cup) Material enters airway, passes  BELOW cords and not ejected out despite cough attempt by patient  Pharyngeal - Nectar Straw Delayed swallow initiation;Premature spillage to  pyriform sinuses;Reduced epiglottic inversion;Pharyngeal residue -  valleculae;Reduced laryngeal elevation;Reduced airway/laryngeal  closure;Reduced tongue base retraction;Penetration/Aspiration before  swallow  Penetration/Aspiration details (nectar straw) Material enters airway,  passes BELOW cords and not ejected out despite cough attempt by patient  Pharyngeal - Nectar Syringe (None)  Penetration/Aspiration details (nectar syringe) (None)  Pharyngeal - Ice Chips (None)  Penetration/Aspiration details (ice chips) (None)  Pharyngeal - Thin Teaspoon (None)  Penetration/Aspiration details (thin teaspoon) (None)  Pharyngeal - Thin Cup Delayed swallow initiation;Premature spillage to  pyriform sinuses;Reduced epiglottic inversion;Reduced laryngeal  elevation;Reduced airway/laryngeal closure;Reduced tongue base  retraction;Penetration/Aspiration before swallow;Pharyngeal residue -  valleculae;Trace aspiration  Penetration/Aspiration details (thin cup) Material enters airway, passes  BELOW cords and not ejected out despite cough attempt by patient  Pharyngeal - Thin Straw (None)  Penetration/Aspiration details (thin straw) (None)  Pharyngeal - Thin Syringe (None)  Penetration/Aspiration details (thin syringe') (None)   Pharyngeal - Puree Delayed swallow initiation;Premature spillage to  pyriform sinuses;Reduced epiglottic inversion;Reduced laryngeal  elevation;Reduced airway/laryngeal closure;Reduced tongue base  retraction;Pharyngeal residue - valleculae  Penetration/Aspiration details (puree) (None)  Pharyngeal - Mechanical Soft (None)  Penetration/Aspiration details (mechanical soft) (None)  Pharyngeal - Regular (None)  Penetration/Aspiration details (regular) (None)  Pharyngeal - Multi-consistency (None)  Penetration/Aspiration details (multi-consistency) (None)  Pharyngeal - Pill (None)  Penetration/Aspiration details (pill) (None)  Pharyngeal Comment (None)     No flowsheet data found.  No flowsheet data found.         DeBlois, Riley NearingBonnie Caroline 12/30/2014, 1:26 PM     CBC No results for input(s): WBC, HGB, HCT, PLT, MCV, MCH, MCHC, RDW, LYMPHSABS, MONOABS, EOSABS, BASOSABS, BANDABS in the last 168 hours.  Invalid input(s): NEUTRABS, BANDSABD  Chemistries  No results for input(s): NA, K, CL, CO2, GLUCOSE, BUN, CREATININE, CALCIUM, MG, AST, ALT, ALKPHOS, BILITOT in the last 168 hours.  Invalid input(s): GFRCGP ------------------------------------------------------------------------------------------------------------------ estimated creatinine clearance is 80.7 mL/min (by C-G formula based on Cr of 1.22). ------------------------------------------------------------------------------------------------------------------ No results for input(s): HGBA1C in the last 72 hours. ------------------------------------------------------------------------------------------------------------------ No results for input(s): CHOL, HDL, LDLCALC, TRIG, CHOLHDL, LDLDIRECT in the last 72 hours. ------------------------------------------------------------------------------------------------------------------ No results for input(s): TSH, T4TOTAL, T3FREE, THYROIDAB in the last 72 hours.  Invalid input(s):  FREET3 ------------------------------------------------------------------------------------------------------------------ No results for input(s): VITAMINB12, FOLATE, FERRITIN, TIBC, IRON, RETICCTPCT in the last 72 hours.  Coagulation profile No results for input(s): INR, PROTIME in the last 168 hours.  No results for input(s): DDIMER in the last 72 hours.  Cardiac Enzymes No results for input(s): CKMB, TROPONINI, MYOGLOBIN in the last 168 hours.  Invalid input(s): CK ------------------------------------------------------------------------------------------------------------------ Invalid input(s): POCBNP  No results for input(s): GLUCAP in the last 72 hours.   Randol KernELGERGAWY, Danyon Mcginness M.D. [facility] 01/09/2015, 2:03 PM  Pager: [phone]  Between  7am to 7pm - call Pager - [phone]  After 7pm go to [email or web address] - password [email or web address]  Call night coverage person covering after 7pm

## 2015-01-09 NOTE — Progress Notes (Signed)
40ml Dilaudid wasted in sink. Witness by Lorine BearsJackie Clark RN

## 2015-01-09 NOTE — Consult Note (Addendum)
[place] ( LATE ENTRY from 01/08/15 1400) Met with patient's brother Oren [facility]) and aunt Hunt Oris in consult room to continue explanation of services, complete registration paper work and offer support. Brother Jenny Reichmann understandably having difficult time and very emotional. Family experienced multiple significant losses in past three years. John states "me and Rod Holler will be all that's left." Rod Holler has always been and continues to be strong support to [employer]. John now agreeable to transfer to [facility] Monday if transfer still makes sense. They completed paper work. John requests that Rod Holler be called when transfer is confirmed on Monday. Will followup Monday for updates and to confirm if transfer still makes sense. Appreciate help from bedside RN today. [facility] aware of drip change to propofol and fentanyl Saturday evening. Dr. Hilma Favors is aware of potential plan for transfer Monday. CSW Claiborne Billings also aware. Thank you. Erling Conte, LCSW

## 2015-01-10 MED ORDER — HALOPERIDOL LACTATE 5 MG/ML IJ SOLN: 1.0000 mg | INTRAMUSCULAR | Status: AC | PRN

## 2015-01-10 MED ORDER — PROPOFOL 1000 MG/100ML IV EMUL: 4.0000 ug/kg/min | INTRAVENOUS | Status: AC

## 2015-01-10 MED ORDER — PROPOFOL BOLUS VIA INFUSION: 0.2500 mg/kg | INTRAVENOUS | Status: AC | PRN

## 2015-01-10 MED ORDER — ARTIFICIAL TEARS OP OINT: TOPICAL_OINTMENT | Freq: Three times a day (TID) | OPHTHALMIC | Status: AC

## 2015-01-10 MED ORDER — BISACODYL 10 MG RE SUPP: 10.0000 mg | Freq: Every day | RECTAL | Status: AC | PRN

## 2015-01-10 MED ORDER — FENTANYL CITRATE (PF) 2500 MCG/50ML IJ SOLN: 100.0000 ug/h | Status: AC

## 2015-01-10 MED ORDER — FENTANYL BOLUS VIA INFUSION: 100.0000 ug | INTRAVENOUS | Status: AC | PRN

## 2015-01-10 NOTE — Progress Notes (Signed)
CSW continuing to follow.  CSW received notification from [facility], Erling Conte that pt has bed available at [facility] today.  CSW spoke with MD who feels that pt is stable for transfer to [facility] today, but feels that pt will need to remain on continuous propofol and fentanyl drips during transport to [facility]. CSW spoke with [facility], Erling Conte who confirmed that [facility] has pump and medications at [facility] ready for pt arrival.CSW contacted [facility] to inquire about transport as [facility] can not manage continuous drips. CSW spoke with [facility] regarding transport and [place] recommended this CSW to contact [facility] to determine if [facility] has truck available to provide transportation for pt given pt needs.   CSW contacted [facility] and notified [facility] of pt needs for transport. CSW spoke with director of [facility] who approved for [facility] to provide the transportation for pt to [place] will notify [facility] shift commander that [place] can provide the transport. CSW provided [facility] with pt information and RN was able to provide medical information for pt.   CSW facilitated pt discharge needs by confirming [facility] liaison, Erling Conte faxed discharge summary, arranging [facility] for transport to [facility] given pt need for continuous drips, confirming RN called report, and CSW present when MD contacted pt aunt, Rod Holler regarding plan for [facility] this afternoon and MD notified pt aunt, Rod Holler that transport would be arranged. Pt brother, Jenny Reichmann and pt aunt, Rod Holler met with [facility] liaison, Erling Conte on Saturday to complete paperwork. Pt aunt, Rod Holler notified MD that she will notify pt brother, Jenny Reichmann regarding transfer to [facility] today.   No further social work needs identified at this time.  CSW signing off.   Alison Murray, MSW, [place] [facility] [phone]

## 2015-01-10 NOTE — Discharge Instructions (Signed)
[facility] comfort care, [facility].

## 2015-01-10 NOTE — Discharge Summary (Addendum)
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 Care Recommendations (None)  Other Recommendations (None)     CHL IP FOLLOW UP RECOMMENDATIONS 12/30/2014  Follow up Recommendations Skilled Nursing facility     [facility] D[facility]  Speech Therapy Frequency (ACUTE ONLY) min 2x/week  Treatment Duration 2 weeks     Pertinent Vitals/Pain NA    SLP Swallow Goals No flowsheet data found.  No flowsheet data found.    No flowsheet data found.   CHL IP ORAL PHASE 12/30/2014  Lips (None)  Tongue (None)  Mucous membranes (None)  Nutritional status (None)  Other (None)  Oxygen therapy (None)  Oral Phase Impaired  Oral - Pudding Teaspoon (None)  Oral - Pudding Cup (None)  Oral - Honey Teaspoon Lingual/palatal residue;Other (Comment)  Oral - Honey Cup Lingual/palatal residue;Other (Comment)  Oral - Honey Syringe (None)  Oral - Nectar Teaspoon Lingual/palatal residue;Other (Comment)  Oral - Nectar Cup Lingual/palatal residue;Other (Comment)  Oral - Nectar Straw Lingual/palatal residue;Other (Comment)  Oral - Nectar Syringe (None)  Oral - Ice Chips (None)  Oral - Thin Teaspoon (None)  Oral - Thin Cup Lingual/palatal residue;Other (Comment)  Oral - Thin Straw (None)  Oral - Thin Syringe (None)  Oral - Puree Lingual/palatal  residue;Other (Comment)  Oral - Mechanical Soft (None)  Oral - Regular (None)  Oral - Multi-consistency (None)  Oral - Pill (None)  Oral Phase - Comment (None)      CHL IP PHARYNGEAL PHASE 12/30/2014  Pharyngeal Phase Impaired  Pharyngeal - Pudding Teaspoon (None)  Penetration/Aspiration details (pudding teaspoon) (None)  Pharyngeal - Pudding Cup (None)  Penetration/Aspiration details (pudding cup) (None)  Pharyngeal - Honey Teaspoon Delayed swallow initiation;Premature spillage  to pyriform sinuses;Reduced epiglottic inversion;Pharyngeal residue -  valleculae;Reduced laryngeal elevation;Reduced airway/laryngeal  closure;Reduced tongue base retraction;Penetration/Aspiration during  swallow  Penetration/Aspiration details (honey teaspoon) Material enters airway,  passes  BELOW cords and not ejected out despite cough attempt by patient  Pharyngeal - Honey Cup Delayed swallow initiation;Premature spillage to  pyriform sinuses;Reduced epiglottic inversion;Pharyngeal residue -  valleculae;Reduced laryngeal elevation;Reduced airway/laryngeal  closure;Reduced tongue base retraction;Penetration/Aspiration during  swallow  Penetration/Aspiration details (honey cup) Material enters airway, passes  BELOW cords and not ejected out despite cough attempt by patient  Pharyngeal - Honey Syringe (None)  Penetration/Aspiration details (honey syringe) (None)  Pharyngeal - Nectar Teaspoon (None)  Penetration/Aspiration details (nectar teaspoon) (None)  Pharyngeal - Nectar Cup Delayed swallow initiation;Premature spillage to  pyriform sinuses;Reduced epiglottic inversion;Pharyngeal residue -  valleculae;Reduced laryngeal elevation;Reduced airway/laryngeal  closure;Reduced tongue base retraction;Penetration/Aspiration during  swallow;Penetration/Aspiration before swallow  Penetration/Aspiration details (nectar cup) Material enters airway, passes  BELOW cords and not ejected out despite cough attempt by patient  Pharyngeal - Nectar Straw  Delayed swallow initiation;Premature spillage to  pyriform sinuses;Reduced epiglottic inversion;Pharyngeal residue -  valleculae;Reduced laryngeal elevation;Reduced airway/laryngeal  closure;Reduced tongue base retraction;Penetration/Aspiration before  swallow  Penetration/Aspiration details (nectar straw) Material enters airway,  passes BELOW cords and not ejected out despite cough attempt by patient  Pharyngeal - Nectar Syringe (None)  Penetration/Aspiration details (nectar syringe) (None)  Pharyngeal - Ice Chips (None)  Penetration/Aspiration details (ice chips) (None)  Pharyngeal - Thin Teaspoon (None)  Penetration/Aspiration details (thin teaspoon) (None)  Pharyngeal - Thin Cup Delayed swallow initiation;Premature spillage to  pyriform sinuses;Reduced epiglottic inversion;Reduced laryngeal  elevation;Reduced airway/laryngeal closure;Reduced tongue base  retraction;Penetration/Aspiration before swallow;Pharyngeal residue -  valleculae;Trace aspiration  Penetration/Aspiration details (thin cup) Material enters airway, passes  BELOW cords and not ejected out despite cough attempt by patient  Pharyngeal - Thin Straw (None)  Penetration/Aspiration details (thin straw) (None)  Pharyngeal - Thin Syringe (None)  Penetration/Aspiration details (thin syringe') (None)  Pharyngeal - Puree Delayed swallow initiation;Premature spillage to  pyriform sinuses;Reduced epiglottic inversion;Reduced laryngeal  elevation;Reduced airway/laryngeal closure;Reduced tongue base  retraction;Pharyngeal residue - valleculae  Penetration/Aspiration details (puree) (None)  Pharyngeal - Mechanical Soft (None)  Penetration/Aspiration details (mechanical soft) (None)  Pharyngeal - Regular (None)  Penetration/Aspiration details (regular) (None)  Pharyngeal - Multi-consistency (None)  Penetration/Aspiration details (multi-consistency) (None)  Pharyngeal - Pill (None)  Penetration/Aspiration details (pill) (None)  Pharyngeal Comment (None)     No  flowsheet data found.  No flowsheet data found.         DeBlois, Riley Nearing 12/30/2014, 1:26 PM      Today   Subjective:   Korry Dalgleish appears comfortable in bed on sedation.  Objective:   Blood pressure 100/55, pulse 71, temperature 97.8 F (36.6 C), temperature source Axillary, resp. rate 8, height 5\' 8"  (1.727 m), weight 96.5 kg (212 lb 11.9 oz), SpO2 88 %.  Intake/Output Summary (Last 24 hours) at 01/10/15 1252 Last data filed at 01/10/15 1000  Gross per 24 hour  Intake  474.5 ml  Output    965 ml  Net -490.5 ml    Exam   General: Comfortable, does not follow any commands, noncommunicative.  Neck: Supple, no JVD, trach+  CVS: S1 S2 clear  Pulmonary : Decreased breath sounds  Abdomen: Soft,NT, ND,   Neuro: Unresponsive  Psych: Comfortable, does not follow any commands Data Review   Cultures -   CBC w Diff:  Lab Results  Component Value Date   WBC 6.0 12/30/2014   HGB 7.2* 12/30/2014   HCT 21.8* 12/30/2014   PLT 105* 12/30/2014   LYMPHOPCT 22 12/29/2014   MONOPCT 6 12/29/2014   EOSPCT 4 12/29/2014  BASOPCT 0 12/29/2014   CMP:  Lab Results  Component Value Date   NA 148* 12/30/2014   K 3.6 12/30/2014   CL 114* 12/30/2014   CO2 28 12/30/2014   BUN 7 12/30/2014   CREATININE 1.22 12/30/2014   PROT 5.8* 12/27/2014   ALBUMIN 2.6* 12/27/2014   BILITOT 0.8 12/27/2014   ALKPHOS 84 12/27/2014   AST 37 12/27/2014   ALT 35 12/27/2014  .  Micro Results No results found for this or any previous visit (from the past 240 hour(s)).   Discharge Instructions        Discharge Medications     Medication List    STOP taking these medications        *STUDY* feeding supplement (IMPACT PEPTIDE 1.5) Liqd     acetaminophen 650 MG CR tablet  Commonly known as:  TYLENOL     antiseptic oral rinse Liqd     apixaban 5 MG Tabs tablet  Commonly known as:  ELIQUIS     ARIPiprazole 10 MG tablet  Commonly known as:  ABILIFY     baclofen 10 MG tablet   Commonly known as:  LIORESAL     chlorhexidine 0.12 % solution  Commonly known as:  PERIDEX     clonazePAM 0.5 MG tablet  Commonly known as:  KLONOPIN     diphenhydrAMINE 50 MG/ML injection  Commonly known as:  BENADRYL     divalproex 125 MG capsule  Commonly known as:  DEPAKOTE SPRINKLE     eucerin cream     free water Soln     HYDRALAZINE HCL IJ     Hypromellose 0.2 % Soln     insulin regular 100 units/mL injection  Commonly known as:  NOVOLIN R,HUMULIN R     ipratropium-albuterol 0.5-2.5 (3) MG/3ML Soln  Commonly known as:  DUONEB     LISINOPRIL PO     LORAZEPAM IJ     morphine 30 MG tablet  Commonly known as:  MSIR     Morphine Sulfate (PF) 2 MG/ML Soln     neomycin-bacitracin-polymyxin Oint  Commonly known as:  NEOSPORIN     nystatin cream  Commonly known as:  MYCOSTATIN     ondansetron 4 MG/2ML Soln injection  Commonly known as:  ZOFRAN     pantoprazole 40 MG injection  Commonly known as:  PROTONIX     PHENobarbital 32.4 MG tablet  Commonly known as:  LUMINAL     polyethylene glycol packet  Commonly known as:  MIRALAX / GLYCOLAX     sertraline 100 MG tablet  Commonly known as:  ZOLOFT     thiamine 100 MG tablet  Commonly known as:  VITAMIN B-1      TAKE these medications        artificial tears Oint ophthalmic ointment  Place into both eyes every 8 (eight) hours.     bisacodyl 10 MG suppository  Commonly known as:  DULCOLAX  Place 1 suppository (10 mg total) rectally daily as needed for moderate constipation.     Empty Containers Flexible MISC 1 each with fentaNYL 2500 MCG/50ML SOLN 5,000 mcg  Inject 100-400 mcg/hr into the vein continuous.     fentaNYL Soln  Commonly known as:  SUBLIMAZE  Inject 100 mcg into the vein every 15 (fifteen) minutes as needed.     haloperidol lactate 5 MG/ML injection  Commonly known as:  HALDOL  Inject 0.2-0.8 mLs (1-4 mg total) into the vein every 3 (three) hours as  needed (for agitated delirium).      propofol Emul  Commonly known as:  DIPRIVAN  Inject 24.13-48.25 mg into the vein every 30 (thirty) minutes as needed (Distress and Agitation-severe).     propofol 1000 MG/100ML Emul injection  Commonly known as:  DIPRIVAN  Inject 386-3,860 mcg/min into the vein continuous.         Total Time in preparing paper work, data evaluation and todays exam - 35 minutes  Tamyah Cutbirth M.D on 01/10/2015 at 12:52 PM  [facility] Office  [phone]

## 2015-01-23 DEATH — deceased

## 2016-05-01 IMAGING — CT CT ABD-PELV W/O CM
2 of 4 series · 7 of 46 positions shown, 8 images · non-contrast
Comparison: None.

CLINICAL DATA: Acute onset of hematuria. Patient unresponsive.
Abdominal distention. Initial encounter.

EXAM:
CT ABDOMEN AND PELVIS WITHOUT CONTRAST
TECHNIQUE: Multidetector CT imaging of the abdomen and pelvis was performed
following the standard protocol without IV contrast.

[Series 201: routine, idose (2) · axial · 0.83mm/px · z∈[+71,+441]mm · 4 of 104 slices shown, 5 images]
[im 15/104  soft-tissue]
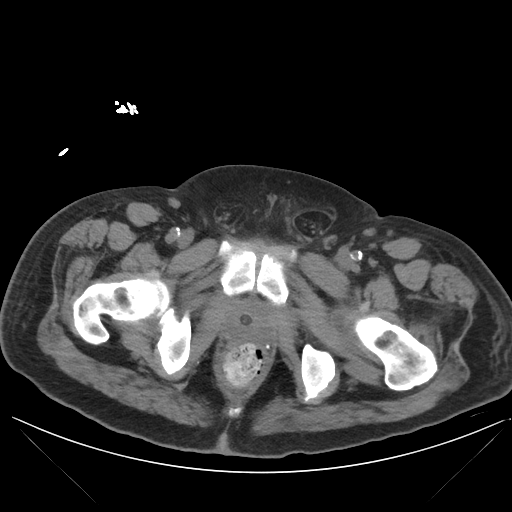
[im 15/104  bone]
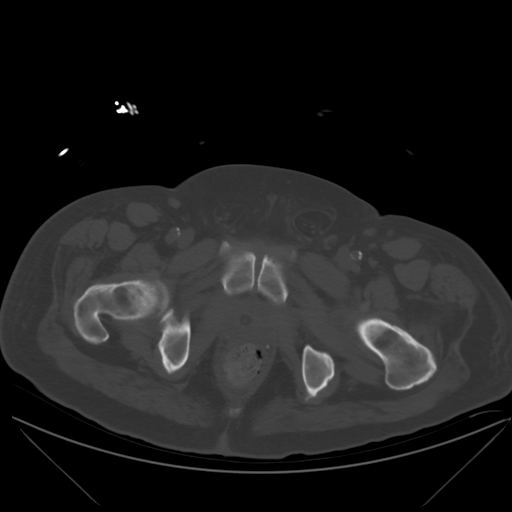
[im 38/104  soft-tissue]
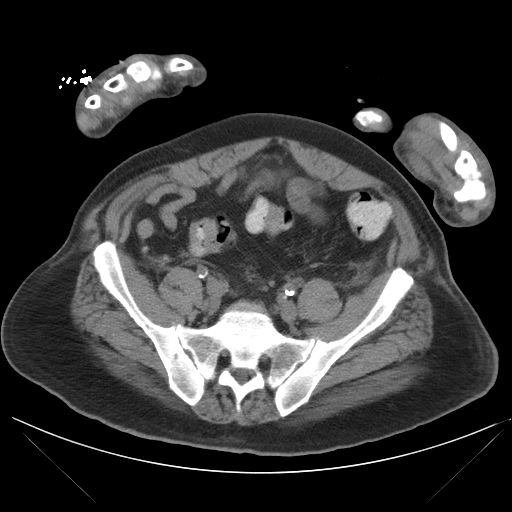
[im 66/104  soft-tissue]
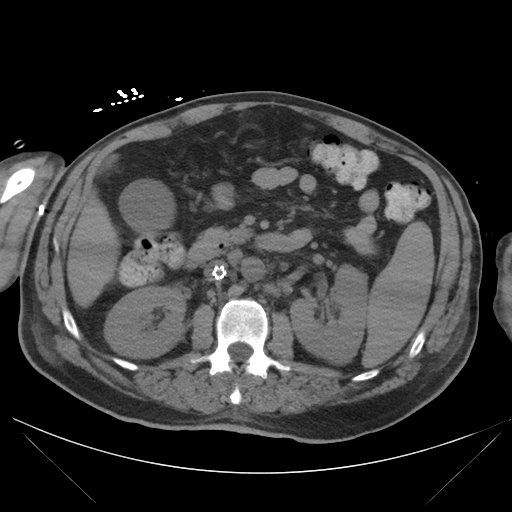
[im 89/104  soft-tissue]
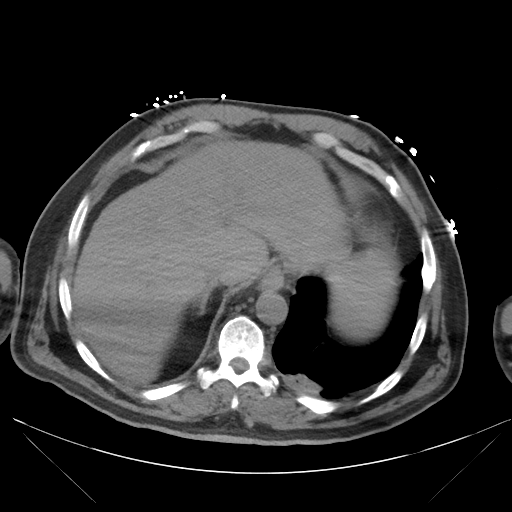

[Series 202: coronals, idose (2) · coronal · 0.45mm/px · 3 of 124 slices shown]
[im 42/124  soft-tissue]
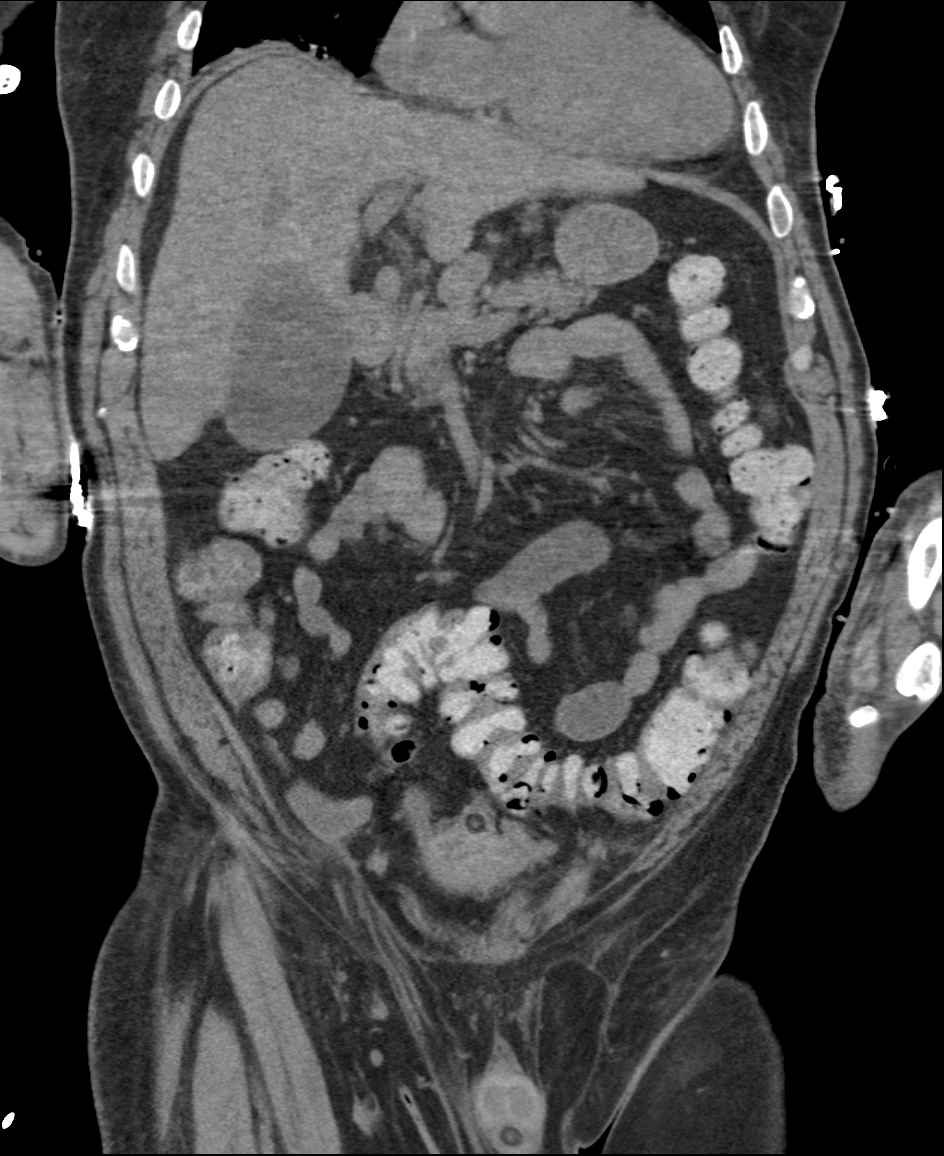
[im 55/124  soft-tissue]
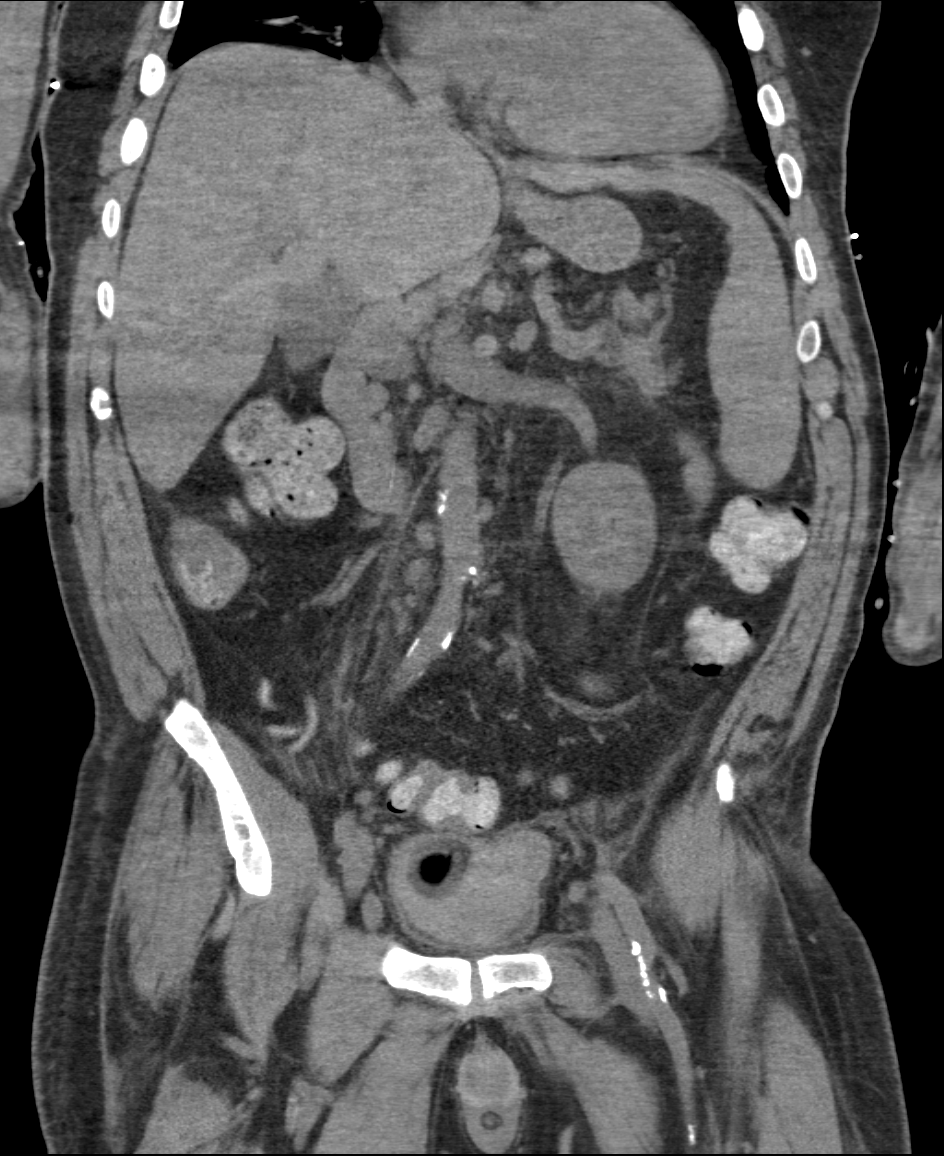
[im 69/124  soft-tissue]
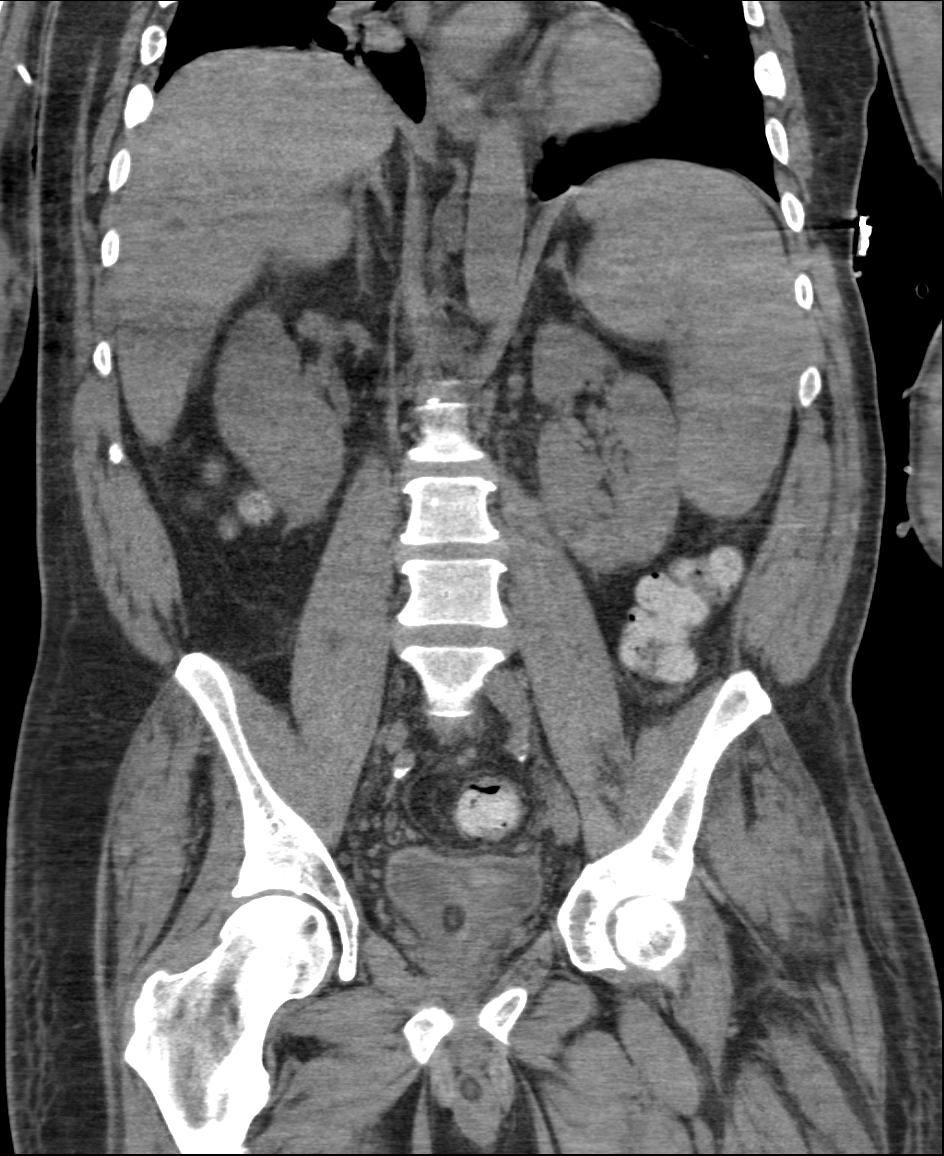

[7 of 46 positions shown; findings below may reference images not displayed]

FINDINGS: Bibasilar airspace opacities, right greater than left, raise concern
for mild pneumonia.

The diffusely nodular contour to the liver raises concern for mild
hepatic cirrhosis. A 1.2 cm hypodensity within the right hepatic
lobe is nonspecific. The spleen is enlarged, measuring 16.1 cm in
length. The gallbladder is somewhat distended. Apparent gallbladder
wall thickening is nonspecific. The common bile duct is dilated,
measuring 8 mm in diameter. This could reflect distal obstruction;
would correlate for associated symptoms, follow-up with LFTs, and
consider MRCP for further evaluation.

The pancreas and adrenal glands are grossly unremarkable in
appearance.

Nonspecific perinephric stranding is noted bilaterally. Mild
right-sided renal pelvicaliectasis remains within normal limits.
There is no evidence of hydronephrosis. No renal or ureteral stones
are seen.

No free fluid is identified. The small bowel is unremarkable in
appearance. A G-tube is noted ending at the body of the stomach; it
extends through the inferior edge of the left hepatic lobe. No acute
vascular abnormalities are seen. Scattered calcification is noted
along the abdominal aorta and its branches. An IVC filter is noted
in expected position.

Scattered paracaval nodes are seen, measuring up to 9 mm in size.
Mildly prominent periaortic nodes measure up to 1.0 cm in short
axis.

The appendix is normal in caliber and contains contrast, without
evidence of appendicitis. The colon is partially filled with
contrast-containing stool, and is unremarkable in appearance.

The bladder is mostly filled with blood, of uncertain etiology.
Mildly asymmetric decreased attenuation is noted along the posterior
aspect of the bladder, mildly more prominent on the right. This may
simply reflect clot or chronic debris, though an underlying mass
cannot be entirely excluded. There is an unusual appearance to the
dome of the bladder, possibly reflecting remote traumatic injury or
a large urachal remnant.

No blood is seen within the renal calyces or ureters. The blood
likely originates from within the bladder. The Foley catheter is
noted in expected position.

Diffuse nonspecific presacral stranding is seen, and there is
nonspecific soft tissue inflammation about the pelvis. No inguinal
lymphadenopathy is seen.

No acute osseous abnormalities are identified.
IMPRESSION: 1. Bladder mostly filled with blood, of uncertain etiology. Mildly
asymmetric decreased attenuation along the posterior aspect of the
bladder, somewhat more prominent on the right. This may simply
reflect clot or chronic debris, though an underlying mass cannot be
entirely excluded. Unusual appearance to the dome of the bladder,
possibly reflecting remote traumatic injury or a large urachal
remnant. No blood seen within the renal calyces and ureters. The
blood likely originates from within the bladder. Foley catheter
noted in expected position.
2. Dilatation of the common bile duct to 8 mm in diameter, with mild
distention of the gallbladder and apparent gallbladder wall
thickening. This could reflect distal obstruction. Would correlate
with LFTs and assess for associated symptoms. Consider MRCP for
further evaluation.
3. Bibasilar airspace opacities, right greater than left, raise
concern for mild pneumonia.
4. Splenomegaly noted.
5. Changes of mild hepatic cirrhosis. Mildly prominent periaortic
and paracaval nodes are nonspecific but could be related to
cirrhosis.
6. Scattered calcification noted along the abdominal aorta and its
branches.
7. Nonspecific soft tissue inflammation about the pelvis, and
diffuse nonspecific presacral stranding seen.

These results were called by telephone at the time of interpretation
on 12/25/2014 at [DATE] to Ebeling RN on [id number], who verbally
acknowledged these results.
